# Patient Record
Sex: Female | Born: 1950 | Race: White | Hispanic: No | Marital: Married | Smoking: Never smoker
Health system: Southern US, Community
[De-identification: ages and names within clinical notes are randomized; demographics above are authoritative.]

## PROBLEM LIST (undated history)

## (undated) DIAGNOSIS — K5120 Ulcerative (chronic) proctitis without complications: Secondary | ICD-10-CM

## (undated) DIAGNOSIS — T7840XA Allergy, unspecified, initial encounter: Secondary | ICD-10-CM

## (undated) DIAGNOSIS — K222 Esophageal obstruction: Secondary | ICD-10-CM

## (undated) DIAGNOSIS — Z961 Presence of intraocular lens: Secondary | ICD-10-CM

## (undated) DIAGNOSIS — K449 Diaphragmatic hernia without obstruction or gangrene: Secondary | ICD-10-CM

## (undated) DIAGNOSIS — M1990 Unspecified osteoarthritis, unspecified site: Secondary | ICD-10-CM

## (undated) DIAGNOSIS — H269 Unspecified cataract: Secondary | ICD-10-CM

## (undated) DIAGNOSIS — K219 Gastro-esophageal reflux disease without esophagitis: Secondary | ICD-10-CM

## (undated) DIAGNOSIS — E079 Disorder of thyroid, unspecified: Secondary | ICD-10-CM

## (undated) HISTORY — DX: Presence of intraocular lens: Z96.1

## (undated) HISTORY — DX: Unspecified osteoarthritis, unspecified site: M19.90

## (undated) HISTORY — DX: Esophageal obstruction: K22.2

## (undated) HISTORY — PX: ESOPHAGOGASTRODUODENOSCOPY: SHX1529

## (undated) HISTORY — DX: Unspecified cataract: H26.9

## (undated) HISTORY — DX: Diaphragmatic hernia without obstruction or gangrene: K44.9

## (undated) HISTORY — PX: COLONOSCOPY: SHX174

## (undated) HISTORY — DX: Allergy, unspecified, initial encounter: T78.40XA

## (undated) HISTORY — PX: CATARACT EXTRACTION: SUR2

## (undated) HISTORY — DX: Ulcerative (chronic) proctitis without complications: K51.20

## (undated) HISTORY — DX: Gastro-esophageal reflux disease without esophagitis: K21.9

## (undated) HISTORY — DX: Disorder of thyroid, unspecified: E07.9

---

## 2006-04-26 ENCOUNTER — Ambulatory Visit: Payer: Self-pay | Admitting: Internal Medicine

## 2006-05-25 ENCOUNTER — Encounter (INDEPENDENT_AMBULATORY_CARE_PROVIDER_SITE_OTHER): Payer: Self-pay | Admitting: Specialist

## 2006-05-25 ENCOUNTER — Ambulatory Visit: Payer: Self-pay | Admitting: Internal Medicine

## 2006-05-25 DIAGNOSIS — K5120 Ulcerative (chronic) proctitis without complications: Secondary | ICD-10-CM

## 2006-07-14 ENCOUNTER — Ambulatory Visit: Payer: Self-pay | Admitting: Internal Medicine

## 2007-05-30 ENCOUNTER — Ambulatory Visit: Payer: Self-pay | Admitting: Internal Medicine

## 2007-06-02 ENCOUNTER — Encounter: Admission: RE | Admit: 2007-06-02 | Discharge: 2007-06-02 | Payer: Self-pay | Admitting: Internal Medicine

## 2007-08-25 DIAGNOSIS — K219 Gastro-esophageal reflux disease without esophagitis: Secondary | ICD-10-CM | POA: Insufficient documentation

## 2007-08-25 DIAGNOSIS — K222 Esophageal obstruction: Secondary | ICD-10-CM

## 2008-05-29 ENCOUNTER — Telehealth: Payer: Self-pay | Admitting: Internal Medicine

## 2008-06-25 ENCOUNTER — Ambulatory Visit: Payer: Self-pay | Admitting: Internal Medicine

## 2008-10-01 ENCOUNTER — Telehealth: Payer: Self-pay | Admitting: Internal Medicine

## 2010-01-27 ENCOUNTER — Telehealth: Payer: Self-pay | Admitting: Internal Medicine

## 2010-03-18 ENCOUNTER — Ambulatory Visit: Payer: Self-pay | Admitting: Internal Medicine

## 2010-08-12 NOTE — Assessment & Plan Note (Signed)
Summary: med refills--ch.    History of Present Illness Visit Type: Follow-up Visit Primary GI MD: [name] MD Primary Provider: n/a Requesting Provider: n/a Chief Complaint: refill Protonix after stopping x 6 months  Did have 1 episode of choking after stopping so resumed taking Prilosec  History of Present Illness:   This is a 60 year old white female with a history of gastroesophageal reflux and a mild esophageal stricture necessitating dilatation in 2007. She subsequently stopped taking Protonix and within several months developed recurrence of her reflux. She had it in the  [place]. She  since then has been on Prilosec 20 mg daily with marked improvement of her symptoms. She denies any dysphagia or odynophagia. Her last colonoscopy in November 2007 showed nonspecific neutrophilic cryptitis consistent with proctitis. This was treated with Canasa suppositories and she had complete resolution at that time.   GI Review of Systems    Reports dysphagia with liquids and  dysphagia with solids.      Denies abdominal pain, acid reflux, belching, bloating, chest pain, heartburn, loss of appetite, nausea, vomiting, vomiting blood, weight loss, and  weight gain.        Denies anal fissure, black tarry stools, change in bowel habit, constipation, diarrhea, diverticulosis, fecal incontinence, heme positive stool, hemorrhoids, irritable bowel syndrome, jaundice, light color stool, liver problems, rectal bleeding, and  rectal pain.    Current Medications (verified): 1)  Prilosec Otc 20 Mg Tbec (Omeprazole Magnesium) .Marland Kitchen.. 1 By Mouth Once Daily  Allergies (verified): No Known Drug Allergies  Past History:  Past Medical History: Reviewed history from 08/25/2007 and no changes required. 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 Unremarkable  Family History: Reviewed history from 06/20/2008 and no changes required. No FH of Colon Cancer:  Social History: Reviewed history from 06/25/2008 and no changes required. Alcohol Use - no Illicit Drug Use - no Patient has never smoked.  Daily Caffeine Use sodas daily  Review of Systems  The patient denies allergy/sinus, anemia, anxiety-new, arthritis/joint pain, back pain, blood in urine, breast changes/lumps, change in vision, confusion, cough, coughing up blood, depression-new, fainting, fatigue, fever, headaches-new, hearing problems, heart murmur, heart rhythm changes, itching, menstrual pain, muscle pains/cramps, night sweats, nosebleeds, pregnancy symptoms, shortness of breath, skin rash, sleeping problems, sore throat, swelling of feet/legs, swollen lymph glands, thirst - excessive , urination - excessive , urination changes/pain, urine leakage, vision changes, and voice change.         Pertinent positive and negative review of systems were noted in the above HPI. All other ROS was otherwise negative.   Vital Signs:  Patient profile:   60 year old female Height:      65 inches Weight:      151 pounds BMI:     25.22 Pulse rate:   80 / minute Pulse rhythm:   regular BP sitting:   110 / 78  (left arm)  Vitals Entered By: Milford Cage NCMA (March 18, 2010 2:10 PM)   Impression & Recommendations:  Problem # 1:  ESOPHAGEAL STRICTURE (ICD-530.3) Patient has a history of gastroesophageal reflux. She denies any dysphagia to solids or liquids.  Problem # 2:  GASTROESOPHAGEAL REFLUX DISEASE (ICD-530.81) Patient's last upper endoscopy was in 2007. She is currently asymptomatic on Prilosec 20 mg daily with only minor breakthrough symptoms. She would prefer to be back on Protonix 40 mg a  day.  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 A recall colonoscopy will be due in November 2012.  Patient Instructions: 1)   Follow antireflux measures. 2)  Protonix 40 mg daily. 3)  Recall colonoscopy November 2012. 4)  The medication list was reviewed and reconciled.  All changed / newly prescribed medications were explained.  A complete medication list was provided to the patient / caregiver. Prescriptions: PROTONIX 40 MG SOLR (PANTOPRAZOLE SODIUM) Take 1 tablet by mouth once a day  #30 x 11   Entered by:   Lamona Curl CMA (AAMA)   Authorized by:   Hart Carwin MD   Signed by:   Lamona Curl CMA (AAMA) on 03/18/2010   Method used:   Electronically to        [facility] [address] [phone]* (retail)       [address]       [place], [place]  [address]       Ph: [phone] or [phone]       Fax: [phone]   RxID:   [id number]

## 2010-08-12 NOTE — Progress Notes (Signed)
Summary: Refill Protonix  Medications Added PROTONIX 40 MG TBEC (PANTOPRAZOLE SODIUM) Take 1 tablet by mouth once a day. NO FURTHER REFILLS UNTIL OFFICE VISIT!       Phone Note Call from Patient   Caller: Patient Call For: Dr. Juanda Chance Reason for Call: Refill Medication Summary of Call: Refill on her Protonix...[facility] [address]  Appt. sch'd for 03-18-10 Initial call taken by: Karna Christmas,  January 27, 2010 8:55 AM  Follow-up for Phone Call        I spoke with patient last year and provided patient with another medication provided she agree to come for evaluation in December 2010. Patient never came. It has been 2 years since we last saw her. Left message the she will need to purchase Protonix OTC until we see her in the office on 03/18/10 at which time we will send Protonix to her pharmacy if needed.    New/Updated Medications: PROTONIX 40 MG TBEC (PANTOPRAZOLE SODIUM) Take 1 tablet by mouth once a day. NO FURTHER REFILLS UNTIL OFFICE VISIT! Prescriptions: PROTONIX 40 MG TBEC (PANTOPRAZOLE SODIUM) Take 1 tablet by mouth once a day. NO FURTHER REFILLS UNTIL OFFICE VISIT!  #30 x 1   Entered by:   Lamona Curl CMA (AAMA)   Authorized by:   Hart Carwin MD   Signed by:   Lamona Curl CMA (AAMA) on 01/27/2010   Method used:   Electronically to        [facility]. [address] [phone]* (retail)       [address]       [place], [place]  [address]       Ph: [phone] or [phone]       Fax: [phone]   RxID:   [id number]  of note: sent [facility] of [facility]ix to [facility] in error before realizing that patient is overdue for office visit as we agreed on previously. I have called [facility] to d/c that prescription. Dottie Nelson-Smith CMA Duncan Dull)  January 27, 2010 9:49 AM

## 2010-11-25 NOTE — Assessment & Plan Note (Signed)
[facility]                         GASTROENTEROLOGY OFFICE NOTE   Cindy Dickson, Cindy Dickson                    MRN:          [id number]  DATE:05/30/2007                            DOB:          [birth year]    HISTORY:  Cindy Dickson is a 60 year old white female, recently diagnosed  with ulcerative proctitis.  This was done on a routine colonoscopy in  November 2007.  She is due for a repeat colonoscopy in November 2010.  Biopsies showed active colitis in the rectum.  She since then has  responded to Canasa suppositories, but whenever she discontinues this,  the bleeding comes back.  She denies any rectal pain or abdominal pain.  Her bowel habits are regular.   Cindy Dickson also has a history of gastroesophageal reflux disease,  benign distal esophageal stricture requiring dilatation in November  2007.  She has no symptoms since she has been switched to Protonix 40 mg  daily.  Prior to that she was on Prilosec 20 mg twice daily.   CURRENT MEDICATIONS:  1. Protonix 40 mg p.o. daily.  2. Multiple vitamins.  3. Canasa suppositories 1000 mg p.r.n.   PHYSICAL EXAMINATION:  VITAL SIGNS:  Blood pressure 110/70, pulse 72,  weight 150 pounds.  GENERAL:  She appeared healthy.  LUNGS:  Clear to auscultation.  COR:  S1 and S2 normal.  ABDOMEN:  Soft, nontender.  Normoactive bowel sounds.  No focal  tenderness.  RECTAL/ANOSCOPY:  Exams reveal normal peri-anal area.  Contact bleeding  in the anal canal, first-grade internal hemorrhoids, as well as  proctitis, when in the anal canal, with some edema and prolapse of the  rectal mucosa, which was quite friable.   IMPRESSION:  38. A 60 year old white female with ulcerative proctitis which is      rather asymptomatic and is also combined with small hemorrhoids:      It is not clear to me as to whether the bleeding comes from      hemorrhoids or from the proctitis.  She is reluctant to take Canasa      because of  its cost.  We will switch her to Anusol suppositories.  2. Gastroesophageal reflux disease with stricture, currently under      good control.   PLAN:  1. Refills for Protonix 40 mg daily.  2. Anusol-HC suppositories q.h.s., with three refills.   FOLLOWUP:  I will see her again in one year, and a repeat colonoscopy in  November 2010.     Hedwig Morton. Juanda Chance, MD  Electronically Signed    DMB/MedQ  DD: 05/30/2007  DT: 05/31/2007  Job #: [id number]

## 2010-11-28 NOTE — Assessment & Plan Note (Signed)
[facility]                           GASTROENTEROLOGY OFFICE NOTE   Cindy Dickson, Cindy Dickson                    MRN:          [id number]  DATE:04/26/2006                            DOB:          [birth year]    Cindy Dickson is a very nice 60 year old white female who has two GI issues.  One is gastroesophageal reflux, which she has had for many years and was  told to have a hiatal hernia but never had an upper endoscopy.  She had  episodes of solid food dysphagia and food regurgitation until her brother-in-  law advised for her to take Prilosec 20 mg, which she took for a period of  time with complete relief of her reflux symptoms.  She was subsequently put  on Protonix 40 mg a day, which controlled her symptoms very well but had  only one month's supply.  Currently, her dysphagia is resolved, and she is  back on Prilosec 20 mg a day.  There is some burping and belching.  Occasional hoarseness but no cough.  She takes Aleve on a p.r.n. basis.   The other issue has been colorectal screening.  She has blood per rectum at  almost every bowel movement in small amounts, usually on the toilet tissue.  She denies any rectal pain, irritation, burning.   MEDICATIONS:  Prilosec 20 mg daily, Aleve.   PAST MEDICAL HISTORY:  Operations none.  Illnesses none.   FAMILY HISTORY:  Negative for colon cancer or polyps.   SOCIAL HISTORY:  She is married.  Has one child.  She does not smoke.  Does  not drink alcohol.   REVIEW OF SYSTEMS:  Positive for eye glasses and night sweats.   PHYSICAL EXAMINATION:  VITAL SIGNS:  Blood pressure 110/68, pulse 80. Weight  159 pounds.  GENERAL:  She was alert and oriented in no distress.  Her voice was normal.  NECK:  Supple with no adenopathy.  HEENT:  Oral cavity was normal.  LUNGS:  Clear to auscultation.  COR:  Normal.  S1 and S2.  ABDOMEN:  Soft, nontender with normoactive bowel sounds.  She had minimal  tenderness over  the subxiphoid area and xiphoid process.  Bowel sounds were  normoactive.  RECTAL:  Trace hemoccult positive stool.   IMPRESSION:  73. A 60 year old white female with chronic gastroesophageal reflux      disease, currently improved on PPI's.  Intermittent solid food      dysphagia suggestive of fibrous ring or stricture.  Reflux most likely      aggravated by use of Aleve.  2. Hemoccult positive stool.  This could be from hemorrhoids, rectal      polyp, or __________ an upper GI lesion as well.  She denies ever being      anemic.   PLAN:  1. Upper endoscopy with possibly dilation and biopsies to rule out      Barrett's esophagus.  2. Colonoscopy scheduled.       Hedwig Morton. Juanda Chance, MD      DMB/MedQ  DD:  04/26/2006  DT:  04/27/2006  Job #:  [id number]

## 2010-11-28 NOTE — Assessment & Plan Note (Signed)
[facility]                         GASTROENTEROLOGY OFFICE NOTE   LINDZY, RUPERT                    MRN:          [id number]  DATE:07/14/2006                            DOB:          March 21, 1951    Ms. Cindy Dickson is a 60 year old white female who was diagnosed with acute  proctitis.  On routine colonoscopy on May 25, 2006 biopsy showed  some neutrophilic cryptitis.  She was put on Canasa suppositories with  complete resolution of rectal bleeding and mucus-like stools after about  5 days.  She had a 20 day treatment of this with Canasa suppositories.  After stopping the medication several weeks ago the blood has  reappeared.  She denies abdominal pain, diarrhea, but has seen blood  with almost every bowel movement.   Patient was also evaluated for chronic esophageal reflux and was found  to have a small hiatal hernia and esophageal stricture.  Patient was  dilated with [facility] dilators.  The dysphagia has subsided.  She is now  well controlled on Prilosec 20 mg daily.   PHYSICAL EXAMINATION:  Blood pressure 100/70, pulse 80, weight 151  pounds which represents a 9 pound weight loss which is intentional.  ABDOMEN:  Normal exam was unremarkable.  LUNGS:  Clear to auscultation.  COR:  Normal S1, S2.    Endoscopic exam shows normal perianal area, normal rectal tone, 2-3+  colitis in the rectum with some contact bleeding, friability and  purulent mucus.  No stool.  Findings are consistent with active  proctitis.   IMPRESSION:  Acute proctitis consistent with active proctitis.  Recurrence after discontinuation of Canasa suppositories.  Recent  colonoscopy did not show any extension beyond rectosigmoid colon.   PLAN:  1. Resume Canasa suppositories 1000 mg nightly for at least a week or      two, then cut back to every other day, and even to 2-3 times a      week.  Monitor for bleeding.  If the bleeding recurs increase the      frequency  of Canasa suppositories.  We had discussed the      possibility that this may be a chronic occurrence and as long as      the medication works we would continue with topical treatment.  I      would like to reexamine her in 8 weeks.  2. May increase Prilosec to b.i.d. if she becomes symptomatic with      gastroesophageal reflux.     Hedwig Morton. Juanda Chance, MD  Electronically Signed    DMB/MedQ  DD: 07/14/2006  DT: 07/14/2006  Job #: [id number]

## 2011-03-19 ENCOUNTER — Other Ambulatory Visit: Payer: Self-pay | Admitting: Internal Medicine

## 2011-05-01 ENCOUNTER — Other Ambulatory Visit: Payer: Self-pay | Admitting: Internal Medicine

## 2011-05-01 MED ORDER — PANTOPRAZOLE SODIUM 40 MG PO TBEC: DELAYED_RELEASE_TABLET | ORAL | Status: DC

## 2011-05-01 NOTE — Telephone Encounter (Signed)
rx sent

## 2011-05-13 ENCOUNTER — Other Ambulatory Visit: Payer: Self-pay | Admitting: Internal Medicine

## 2011-05-13 MED ORDER — PANTOPRAZOLE SODIUM 40 MG PO TBEC: DELAYED_RELEASE_TABLET | ORAL | Status: DC

## 2011-05-13 NOTE — Telephone Encounter (Signed)
rx sent

## 2011-06-09 ENCOUNTER — Encounter: Payer: Self-pay | Admitting: *Deleted

## 2011-06-15 ENCOUNTER — Encounter: Payer: Self-pay | Admitting: Internal Medicine

## 2011-06-15 ENCOUNTER — Ambulatory Visit (INDEPENDENT_AMBULATORY_CARE_PROVIDER_SITE_OTHER): Payer: 59 | Admitting: Internal Medicine

## 2011-06-15 VITALS — BP 120/72 | HR 68 | Ht 65.0 in | Wt 157.0 lb

## 2011-06-15 DIAGNOSIS — K219 Gastro-esophageal reflux disease without esophagitis: Secondary | ICD-10-CM

## 2011-06-15 DIAGNOSIS — K222 Esophageal obstruction: Secondary | ICD-10-CM

## 2011-06-15 MED ORDER — PANTOPRAZOLE SODIUM 40 MG PO TBEC: DELAYED_RELEASE_TABLET | ORAL | Status: DC

## 2011-06-15 NOTE — Patient Instructions (Signed)
We have sent the following medications to your pharmacy for you to pick up at your convenience:  Pantoprazole  

## 2011-06-15 NOTE — Progress Notes (Signed)
Cindy Dickson 1951/05/03 MRN [id number]    History of Present Illness:  This is a 60 year old white female with chronic gastroesophageal reflux under good control with pantoprazole 40 mg daily. She has a history of a mild esophageal stricture on an upper endoscopy in November 2007 which was done for dysphagia. She was dilated with 15, 16, 17 mm Savary dilators. She denies having any dysphagia or odynophagia. She was found to have a 3 cm hiatal hernia. She takes generic Protonix 40 mg every morning without any breakthrough symptoms. Her last colonoscopy in November 2007 showed chronic active colitis. The biopsies showed crypt distortion and foci ulceration with mild neutrophilia cryptitis. The findings most likely represented chronic active inflammatory bowel disease with focal ulceration. Patient denies having any symptoms. She denies rectal bleeding, rectal pain, diarrhea or leakage. She is convinced that it was her hemorrhoids. We had her up for a recall colonoscopy for 3 years but she declines at this time.   Past Medical History  Diagnosis Date  . Esophageal stricture   . GERD (gastroesophageal reflux disease)   . Ulcerative proctitis   . Hiatal hernia    No past surgical history on file.  reports that she has never smoked. She has never used smokeless tobacco. She reports that she does not drink alcohol or use illicit drugs. family history is negative for Colon cancer. No Known Allergies      Review of Systems: Denies heartburn, negative for dysphagia odynophagia. Chest pain or shortness of breath  The remainder of the 10 point ROS is negative except as outlined in H&P   Physical Exam: General appearance  Well developed, in no distress. Eyes- non icteric. HEENT nontraumatic, normocephalic. Mouth no lesions, tongue papillated, no cheilosis. Neck supple without adenopathy, thyroid not enlarged, no carotid bruits, no JVD. Lungs Clear to auscultation bilaterally. Cor normal  S1, normal S2, regular rhythm, no murmur,  quiet precordium. Abdomen: Soft nontender abdomen with normal active bowel sounds. No focal tenderness. Normoactive bowel sounds. Rectal: Not done. Extremities no pedal edema. Skin no lesions. Neurological alert and oriented x 3. Psychological normal mood and affect.  Assessment and Plan:  Problem #1 Chronic gastroesophageal reflux and esophageal stricture is well-controlled on pantoprazole 40 mg a day. Patient is here for refills. She needs to continue on her medications to prevent recurrence of an esophageal stricture. She will follow antireflux measures.  Problem #2 History of ulcerative proctitis. Patient is currently asymptomatic. She sould have a recall colonoscopy in November 2014.   06/15/2011 Cindy Dickson

## 2011-07-16 ENCOUNTER — Encounter: Payer: Self-pay | Admitting: Internal Medicine

## 2012-08-03 ENCOUNTER — Other Ambulatory Visit: Payer: Self-pay | Admitting: Internal Medicine

## 2012-08-03 MED ORDER — PANTOPRAZOLE SODIUM 40 MG PO TBEC: DELAYED_RELEASE_TABLET | ORAL | Status: DC

## 2012-08-03 NOTE — Telephone Encounter (Signed)
rx sent

## 2012-09-13 ENCOUNTER — Ambulatory Visit: Payer: 59 | Admitting: Internal Medicine

## 2012-10-18 ENCOUNTER — Ambulatory Visit: Payer: 59 | Admitting: Internal Medicine

## 2012-10-20 ENCOUNTER — Ambulatory Visit (INDEPENDENT_AMBULATORY_CARE_PROVIDER_SITE_OTHER): Payer: 59 | Admitting: Emergency Medicine

## 2012-10-20 VITALS — BP 117/72 | HR 86 | Temp 98.1°F | Resp 16 | Ht 64.5 in | Wt 160.0 lb

## 2012-10-20 DIAGNOSIS — IMO0002 Reserved for concepts with insufficient information to code with codable children: Secondary | ICD-10-CM

## 2012-10-20 DIAGNOSIS — Z23 Encounter for immunization: Secondary | ICD-10-CM

## 2012-10-20 NOTE — Progress Notes (Signed)
[facility] [address] [place] [address] [phone]  Date:  10/20/2012   Name:  Cindy Dickson   DOB:  29-Dec-1950   MRN:  [id number]  PCP:  No primary provider on file.    Chief Complaint: Laceration   History of Present Illness:  Cindy Dickson is a 62 y.o. very pleasant female patient who presents with the following:  Laceration right second finger.  Not current on TD.  No improvement with over the counter medications or other home remedies. Denies other complaint or health concern today.   Patient Active Problem List  Diagnosis  . ESOPHAGEAL STRICTURE  . GASTROESOPHAGEAL REFLUX DISEASE  . ULCERATIVE PROCTITIS    Past Medical History  Diagnosis Date  . Esophageal stricture   . GERD (gastroesophageal reflux disease)   . Ulcerative proctitis   . Hiatal hernia     History reviewed. No pertinent past surgical history.  History  Substance Use Topics  . Smoking status: Never Smoker   . Smokeless tobacco: Never Used  . Alcohol Use: No    Family History  Problem Relation Age of Onset  . Colon cancer Neg Hx     No Known Allergies  Medication list has been reviewed and updated.  Current Outpatient Prescriptions on File Prior to Visit  Medication Sig Dispense Refill  . pantoprazole (PROTONIX) 40 MG tablet Take 1 tablet by mouth once daily.  90 tablet  0   No current facility-administered medications on file prior to visit.    Review of Systems:  As per HPI, otherwise negative.    Physical Examination: Filed Vitals:   10/20/12 1615  BP: 117/72  Pulse: 86  Temp: 98.1 F (36.7 C)  Resp: 16   Filed Vitals:   10/20/12 1615  Height: 5' 4.5" (1.638 m)  Weight: 160 lb (72.576 kg)   Body mass index is 27.05 kg/(m^2). Ideal Body Weight: Weight in (lb) to have BMI = 25: 147.6   GEN: WDWN, NAD, Non-toxic, Alert & Oriented x 3 HEENT: Atraumatic, Normocephalic.  Ears and Nose: No external deformity. EXTR: No  clubbing/cyanosis/edema NEURO: Normal gait.  PSYCH: Normally interactive. Conversant. Not depressed or anxious appearing.  Calm demeanor.  Superficial laceration right second finger tip.  Assessment and Plan: Laceration finger TDAP   Signed,  Phillips Odor, MD

## 2012-10-20 NOTE — Patient Instructions (Addendum)

## 2012-10-21 NOTE — Progress Notes (Signed)
Reviewed and agree.

## 2012-11-14 ENCOUNTER — Other Ambulatory Visit: Payer: Self-pay | Admitting: Internal Medicine

## 2012-11-28 ENCOUNTER — Ambulatory Visit (INDEPENDENT_AMBULATORY_CARE_PROVIDER_SITE_OTHER): Payer: 59 | Admitting: Internal Medicine

## 2012-11-28 ENCOUNTER — Encounter: Payer: Self-pay | Admitting: Internal Medicine

## 2012-11-28 ENCOUNTER — Other Ambulatory Visit (INDEPENDENT_AMBULATORY_CARE_PROVIDER_SITE_OTHER): Payer: 59

## 2012-11-28 VITALS — BP 118/76 | HR 89 | Ht 65.0 in | Wt 162.0 lb

## 2012-11-28 DIAGNOSIS — E041 Nontoxic single thyroid nodule: Secondary | ICD-10-CM

## 2012-11-28 DIAGNOSIS — K219 Gastro-esophageal reflux disease without esophagitis: Secondary | ICD-10-CM

## 2012-11-28 LAB — CBC WITH DIFFERENTIAL/PLATELET
Basophils Absolute: 0 10*3/uL (ref 0.0–0.1)
Eosinophils Absolute: 0.2 10*3/uL (ref 0.0–0.7)
HCT: 37 % (ref 36.0–46.0)
Hemoglobin: 12.5 g/dL (ref 12.0–15.0)
MCHC: 33.7 g/dL (ref 30.0–36.0)
MCV: 84.6 fl (ref 78.0–100.0)
Monocytes Relative: 7.2 % (ref 3.0–12.0)
Neutro Abs: 5.8 10*3/uL (ref 1.4–7.7)
WBC: 9.4 10*3/uL (ref 4.5–10.5)

## 2012-11-28 LAB — COMPREHENSIVE METABOLIC PANEL
ALT: 18 U/L (ref 0–35)
Albumin: 4.1 g/dL (ref 3.5–5.2)
BUN: 14 mg/dL (ref 6–23)
Calcium: 9.4 mg/dL (ref 8.4–10.5)
Creatinine, Ser: 1.1 mg/dL (ref 0.4–1.2)
GFR: 54.16 mL/min — ABNORMAL LOW (ref >60.00)
Total Protein: 8 g/dL (ref 6.0–8.3)

## 2012-11-28 MED ORDER — PANTOPRAZOLE SODIUM 40 MG PO TBEC: 40.0000 mg | DELAYED_RELEASE_TABLET | Freq: Every day | ORAL | Status: DC

## 2012-11-28 NOTE — Patient Instructions (Addendum)
Your physician has requested that you go to the basement for the following lab work before leaving today: CBC, CMET, TSH  We have sent the following medications to your pharmacy for you to pick up at your convenience: Pantoprazole

## 2012-11-28 NOTE — Progress Notes (Signed)
Cindy Dickson 1951-04-14 MRN [id number]        History of Present Illness:  This is a 62 year old white female with chronic gastroesophageal reflux under good control with pantoprazole 40 mg daily. She had an upper endoscopy in November 2007 which showed mild stricture and 3 cm hiatal hernia. She was dilated with 15,16 and 17 mm dilators resulting in complete relief of her symptoms, she also had a colonoscopy in November 2007 with question of chronic active colitis but she currently denies any symptoms of diarrhea, abdominal pain or constipation. She is here today for refill of  pantoprazole   Past Medical History  Diagnosis Date  . Esophageal stricture   . GERD (gastroesophageal reflux disease)   . Ulcerative proctitis   . Hiatal hernia    History reviewed. No pertinent past surgical history.  reports that she has never smoked. She has never used smokeless tobacco. She reports that she does not drink alcohol or use illicit drugs. family history is negative for Colon cancer. No Known Allergies      Review of Systems:  The remainder of the 10 point ROS is negative except as outlined in H&P   Physical Exam: General appearance  Well developed, in no distress. Eyes- non icteric. HEENT nontraumatic, normocephalic. Mouth no lesions, tongue papillated, no cheilosis. Neck supple without adenopathy, 2-3 cm thyroid nodule left lobe. Nontender. Moves with swallowing. No bruit, no carotid bruits, no JVD. Lungs Clear to auscultation bilaterally. Cor normal S1, normal S2, regular rhythm, no murmur,  quiet precordium. Abdomen: Soft nontender with normoactive bowel sounds Rectal: Not done Extremities no pedal edema. Skin no lesions. Neurological alert and oriented x 3. Psychological normal mood and affect.  Assessment and Plan:  Chronic gastroesophageal reflux disease under good control with pantoprazole 40 mg daily. She will continue antireflux measures. Currently no need for  repeat upper endoscopy or dilatation  Left lobe of thyroid nodule versus cyst. Obtain TSH level. May require ultrasound of the neck. Patient does not have a primary care physician,   Colorectal screening. Last colonoscopy 2007 with findings of colitis. Patient reluctant to do any diagnostic workup since  she is asymptomatic. We will check her CBC and sed rates today      11/28/2012 [name]

## 2012-11-29 ENCOUNTER — Encounter: Payer: Self-pay | Admitting: Internal Medicine

## 2012-11-30 ENCOUNTER — Other Ambulatory Visit: Payer: Self-pay | Admitting: *Deleted

## 2012-11-30 DIAGNOSIS — E049 Nontoxic goiter, unspecified: Secondary | ICD-10-CM

## 2012-12-06 ENCOUNTER — Ambulatory Visit (HOSPITAL_COMMUNITY)
Admission: RE | Admit: 2012-12-06 | Discharge: 2012-12-06 | Disposition: A | Discharge status: Home / Self Care | Payer: 59 | Source: Ambulatory Visit | Attending: Internal Medicine | Admitting: Internal Medicine

## 2012-12-06 DIAGNOSIS — E049 Nontoxic goiter, unspecified: Secondary | ICD-10-CM | POA: Insufficient documentation

## 2012-12-06 DIAGNOSIS — R599 Enlarged lymph nodes, unspecified: Secondary | ICD-10-CM | POA: Insufficient documentation

## 2012-12-07 ENCOUNTER — Other Ambulatory Visit: Payer: Self-pay | Admitting: *Deleted

## 2012-12-07 DIAGNOSIS — E079 Disorder of thyroid, unspecified: Secondary | ICD-10-CM

## 2012-12-15 ENCOUNTER — Encounter: Payer: Self-pay | Admitting: Internal Medicine

## 2012-12-15 ENCOUNTER — Ambulatory Visit (INDEPENDENT_AMBULATORY_CARE_PROVIDER_SITE_OTHER): Payer: 59 | Admitting: Internal Medicine

## 2012-12-15 VITALS — BP 122/80 | HR 78 | Ht 65.0 in | Wt 160.0 lb

## 2012-12-15 DIAGNOSIS — E041 Nontoxic single thyroid nodule: Secondary | ICD-10-CM

## 2012-12-15 MED ORDER — LORAZEPAM 0.5 MG PO TABS: ORAL_TABLET | ORAL | Status: DC

## 2012-12-15 NOTE — Patient Instructions (Addendum)
Please return in 1 year. Please join MyChart. I will let you know the results of the biopsy through MyChart.

## 2012-12-15 NOTE — Progress Notes (Signed)
Subjective:     Patient ID: Cindy Dickson, female   DOB: [birth year], 62 y.o.   MRN: [id number]  HPI Cindy Dickson he is a pleasant 62 year old woman, referred by Cindy Dickson (gastroenterology), for management of a recently discovered left thyroid nodule.  Patient is seeing Cindy Dickson for GERD, mild esophageal stricture - status post dilation in 2007, with complete relief of symptoms, hiatal hernia, and ulcerative proctitis. During recent examination by Cindy Dickson, patient was found to have an enlarged left side of the thyroid, and she was sent for a thyroid ultrasound. This was performed on 12/07/2012 and it showed inhomogeneous echogenicity, and also a large, solitary, solid, mass of 3.1 x 2.5 x 2.1 cm in the left thyroid lobe, without calcifications. There is no internal blood flow. She also had the left cervical lymph node measuring 9 x 5 x 7 mm (characterized as normal). Patient was referred to me for further evaluation. Of note, a TSH obtained on 11/28/2012 was normal, at 2.8.  The patient denies problems with swallowing, hoarseness, shortness of breath with lying down, or feeling a mass in her neck before the nodule was palpated by Cindy Dickson. Now, that she is aware of its presence, she can feel it, too. She also denies heat or cold intolerance, palpitations, weight loss (he has 8 pound weight gain in the last 2 months), constipation/hyperdefecation, insomnia/hypersomnia, depression/anxiety, dry skin or hair falling.  She does not have family history of thyroid disease, no family history of thyroid cancer, 62 no personal history of radiation therapy to head or neck.  Review of Systems Constitutional: + weight gain, no fatigue, no subjective hyperthermia/hypothermia Eyes: no blurry vision, no xerophthalmia ENT: no sore throat, + nodules palpated in throat - only after found out about thyroid nodule, no dysphagia/odynophagia, no hoarseness Cardiovascular: no CP/SOB/palpitations/leg  swelling Respiratory: no cough/SOB Gastrointestinal: no N/V/D/C, + GERD Musculoskeletal: no muscle/joint aches Skin: no rashes Neurological: no tremors/numbness/tingling/dizziness Psychiatric: no depression/anxiety  Past Medical History  Diagnosis Date  . Esophageal stricture   . GERD (gastroesophageal reflux disease)   . Ulcerative proctitis   . Hiatal hernia    No past surgical history on file.  History   Social History  . Marital Status: Married    Spouse Name: N/A    Number of Children: 1, of 97 y/o   Occupational History  . Self-employed   Social History Main Topics  . Smoking status: Never Smoker   . Alcohol Use: No  . Drug Use: No   Social History Narrative   Caffeine daily    Current Outpatient Prescriptions on File Prior to Visit  Medication Sig Dispense Refill  . pantoprazole (PROTONIX) 40 MG tablet TAKE 1 TABLET BY MOUTH ONCE DAILY  30 tablet  0  . pantoprazole (PROTONIX) 40 MG tablet Take 1 tablet (40 mg total) by mouth daily.  90 tablet  3   No current facility-administered medications on file prior to visit.   No Known Allergies  Family History  Problem Relation Age of Onset  . Colon cancer Neg Hx     Objective:   Physical Exam BP 126/74  Pulse 82  Ht 5\' 2"  (1.575 m)  Wt 168 lb (76.204 kg)  BMI 30.72 kg/m2  SpO2 98% Wt Readings from Last 3 Encounters:  12/15/12 168 lb (76.204 kg)  11/28/12 162 lb (73.483 kg)  10/20/12 160 lb (72.576 kg)   Constitutional: overweight, in NAD, looking younger than stated age Eyes: PERRLA, EOMI, no exophthalmos ENT:  moist mucous membranes, thyromegaly L>R, no cervical lymphadenopathy Cardiovascular: RRR, No MRG Respiratory: CTA B Gastrointestinal: abdomen soft, NT, ND, BS+ Musculoskeletal: no deformities, strength intact in all 4 Skin: moist, warm, no rashes Neurological: no tremor with outstretched hands, DTR normal in all 4    Assessment:     1. Left thyroid nodule  - 12/07/2012: thyroid ultrasound  with 3.1 x 2.5 x 2.1 cm in the left thyroid lobe, without calcifications. There is no internal blood flow. She also had the left cervical lymph node measuring 9 x 5 x 7 mm (with fatty hilum).  - TSH 2.8 (11/28/2012)    Plan:     I reviewed the images of her thyroid ultrasound along with the patient. I pointed out that the nodule is indeed very large, this being the only apparent risk factor for cancer. Otherwise, the nodule is hyperechoic, without calcifications, without internal blood flow, more wide than tall, and well delimited from surrounding tissue. All these would favor benignity. I believe her risk of cancer is less than 10-15%. - the only way that we can tell exactly if it is cancer or not is by doing a thyroid biopsy (FNA). This is a problem, since the patient has phobia of needles. We discussed about other options, to wait for another 6 months to a year and see if the nodule grows, and only intervene at that time, were even to perform hemithyroidectomy. I think this latter option is premature for now and so does the patient. I did explain that, while thyroid surgery is not a complicated one, it still can have side effects and also she might have a risk of ~25% of becoming hypothyroid after hemithyroidectomy. - patient thought about it and decided to have the FNA done now. I ordered the test, and I also gave her a prescription for lorazepam to take one to 2 tablets before the procedure. I advised her to have somebody to drive her to the procedure (her husband will do this). - I'll see her back in a year, assuming her FNA is normal. If FNA abnormal, we will meet sooner. - I advised her to join my chart and I will send her the results through there     Adequacy Reason Satisfactory For Evaluation. Diagnosis THYROID, FINE NEEDLE ASPIRATION, LEFT LOBE: FOLLICULAR LESION OF UNDETERMINED SIGNIFICANCE. SEE COMMENT. COMMENT: THE SPECIMEN IS HYPERCELLULAR AND CONSISTS OF SMALL TO LARGE SIZED SHEETS  OF FOLLICULAR EPITHELIAL CELLS WITH MILD CYTOLOGIC ATYPIA. THERE IS MINIMAL BACKGROUND COLLOID. BASED ON THESE FINDINGS, A FOLLICULAR LESION/NEOPLASM CAN NOT BE ENTIRELY RULED OUT. Cindy Leisure MD Pathologist, Electronic Signature (Case signed 12/31/2012) Specimen Clinical Information Left large solitary mass 3.1 x 2.5 x 2.1 cm, Goiter on left side of neck  Called pt about FLUS results >> will repeat Bx in 6 months (ordered).

## 2012-12-29 ENCOUNTER — Ambulatory Visit
Admission: RE | Admit: 2012-12-29 | Discharge: 2012-12-29 | Disposition: A | Discharge status: Home / Self Care | Payer: 59 | Source: Ambulatory Visit | Attending: Internal Medicine | Admitting: Internal Medicine

## 2012-12-29 ENCOUNTER — Other Ambulatory Visit (HOSPITAL_COMMUNITY)
Admission: RE | Admit: 2012-12-29 | Discharge: 2012-12-29 | Disposition: A | Discharge status: Home / Self Care | Payer: 59 | Source: Ambulatory Visit | Attending: Diagnostic Radiology | Admitting: Diagnostic Radiology

## 2012-12-29 DIAGNOSIS — E049 Nontoxic goiter, unspecified: Secondary | ICD-10-CM | POA: Insufficient documentation

## 2013-01-05 ENCOUNTER — Other Ambulatory Visit: Payer: Self-pay | Admitting: Internal Medicine

## 2013-01-05 ENCOUNTER — Telehealth: Payer: Self-pay | Admitting: Internal Medicine

## 2013-01-05 DIAGNOSIS — E041 Nontoxic single thyroid nodule: Secondary | ICD-10-CM

## 2013-01-05 NOTE — Addendum Note (Signed)
Addended by: Carlus Pavlov on: 01/05/2013 04:46 PM   Modules accepted: Orders

## 2013-01-05 NOTE — Telephone Encounter (Signed)
Call patient with her results she has been waiting a week

## 2013-01-05 NOTE — Telephone Encounter (Signed)
Called pt with results and plan.  

## 2013-01-05 NOTE — Telephone Encounter (Signed)
Please read note below and advise.  

## 2013-02-27 ENCOUNTER — Encounter: Payer: Self-pay | Admitting: Internal Medicine

## 2013-08-10 ENCOUNTER — Telehealth: Payer: Self-pay | Admitting: Radiology

## 2013-08-10 NOTE — Telephone Encounter (Signed)
Left message requesting patient call to schedule thyroid biopsy.  Empress Newmann Riki Rusk, RN 08/10/2013 2:45 PM

## 2013-10-06 ENCOUNTER — Ambulatory Visit (HOSPITAL_COMMUNITY)
Admission: RE | Admit: 2013-10-06 | Discharge: 2013-10-06 | Disposition: A | Discharge status: Home / Self Care | Payer: 59 | Source: Ambulatory Visit | Attending: Internal Medicine | Admitting: Internal Medicine

## 2013-10-06 DIAGNOSIS — E041 Nontoxic single thyroid nodule: Secondary | ICD-10-CM | POA: Insufficient documentation

## 2013-12-09 ENCOUNTER — Encounter: Payer: Self-pay | Admitting: Internal Medicine

## 2013-12-15 ENCOUNTER — Ambulatory Visit: Payer: 59 | Admitting: Internal Medicine

## 2013-12-29 IMAGING — US US THYROID BIOPSY
1 series · 7 of 7 positions shown · non-contrast
Comparison: 12/06/2012

CLINICAL DATA: Dominant left thyroid nodule.

ULTRASOUND GUIDED NEEDLE ASPIRATE BIOPSY OF THE THYROID GLAND

[Series 1: us thyroid biopsy · 0.07mm/px · 7 acquisitions, 7 frames shown]
[im 1/7]
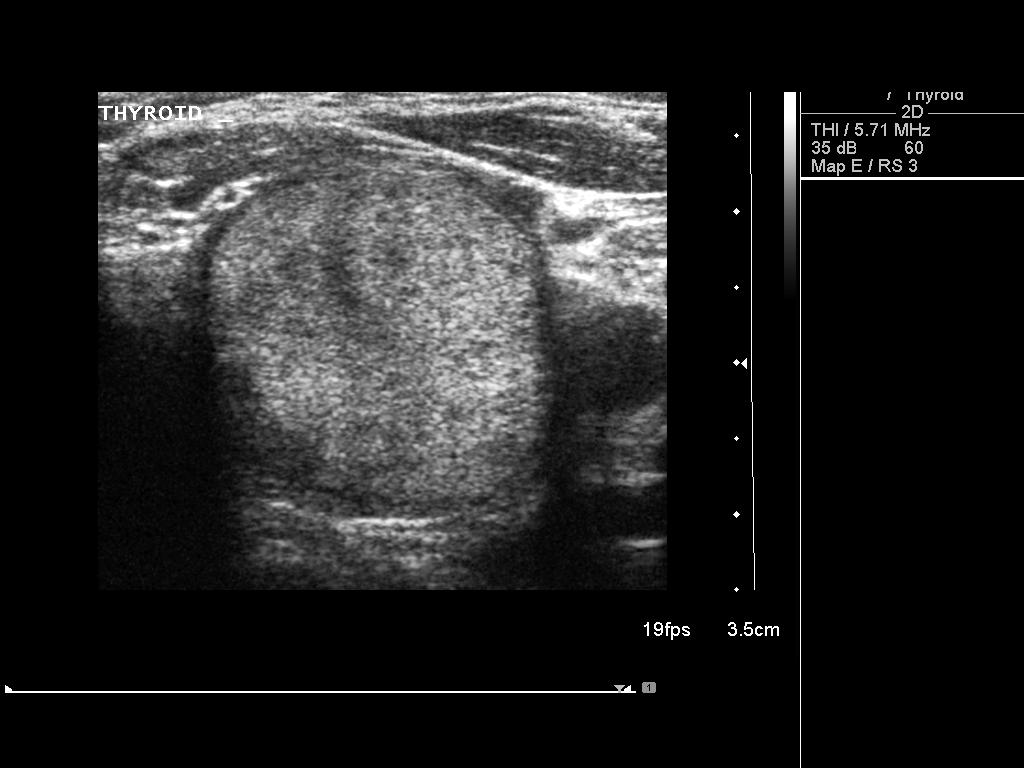
[im 2/7]
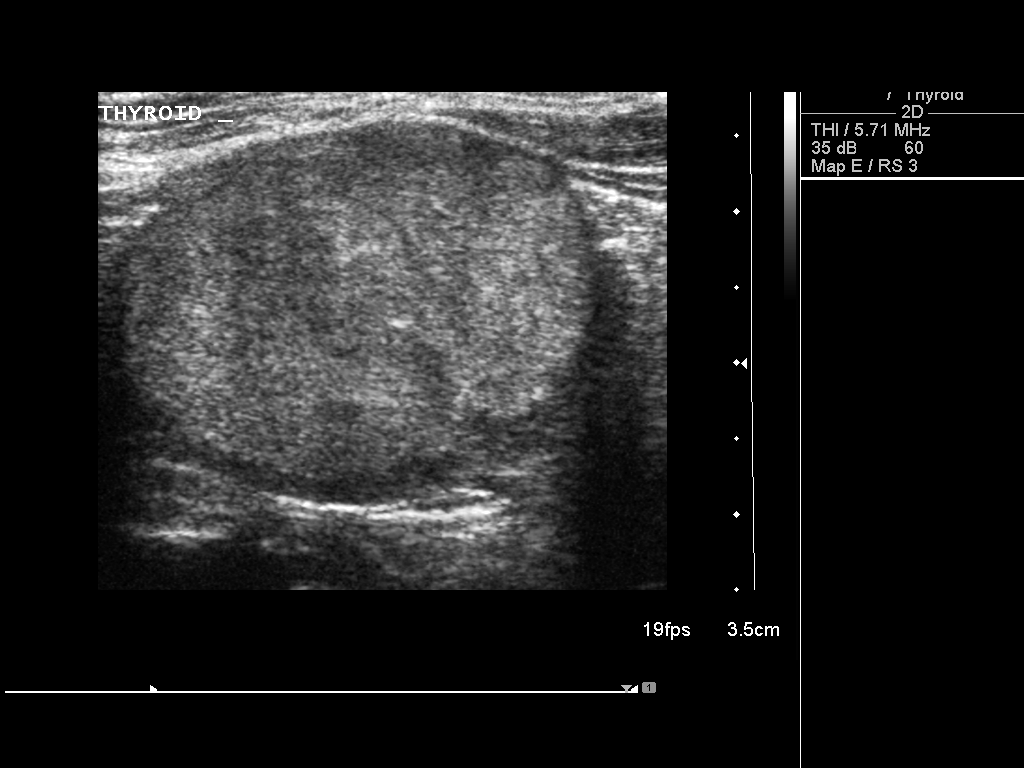
[im 3/7]
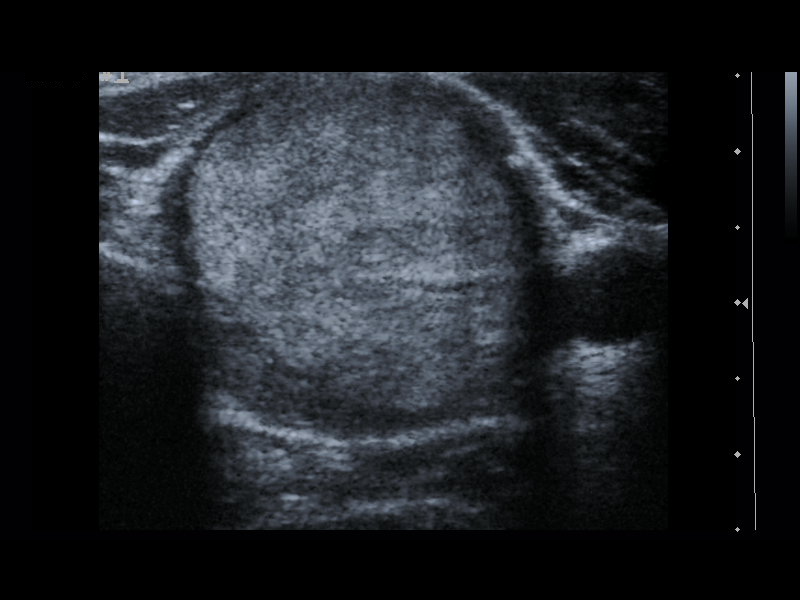
[im 4/7]
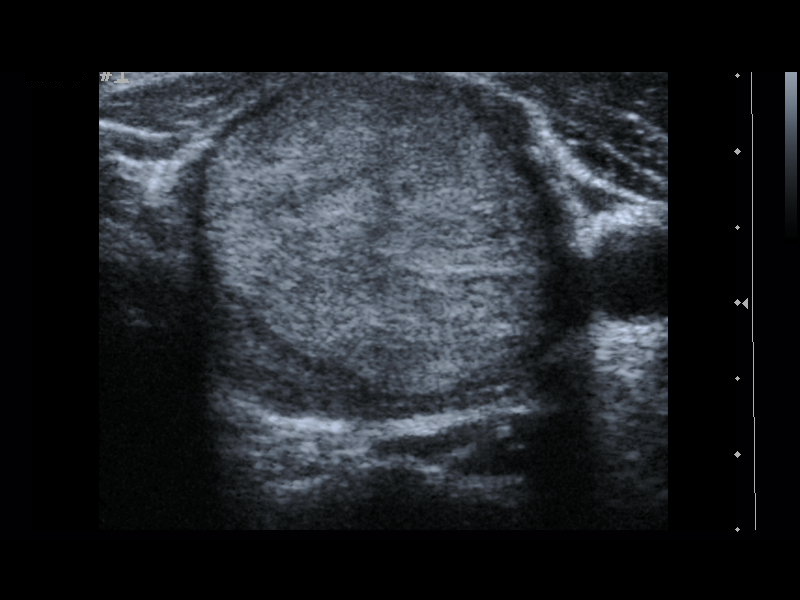
[im 5/7]
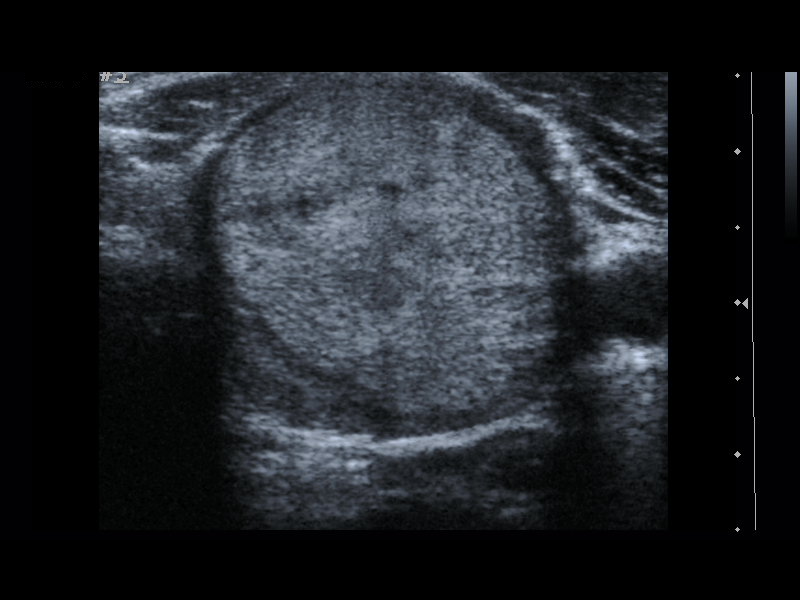
[im 6/7]
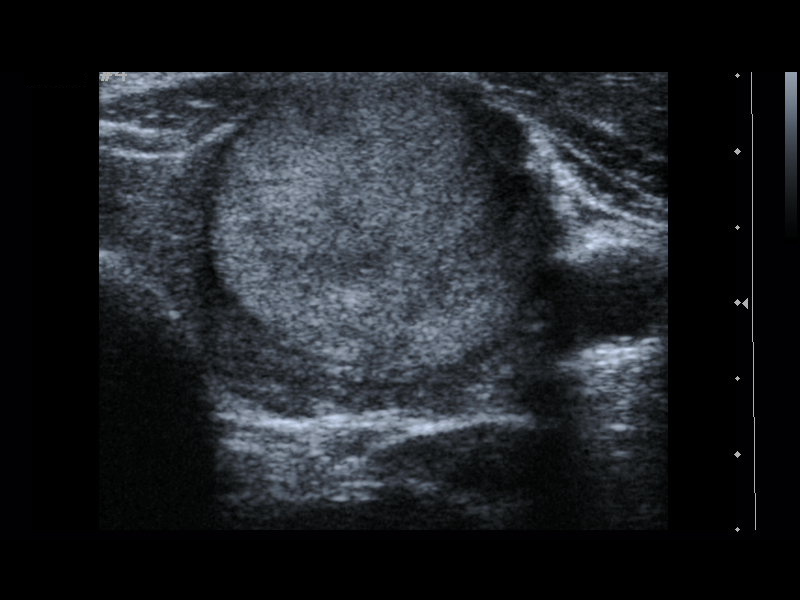
[im 7/7]
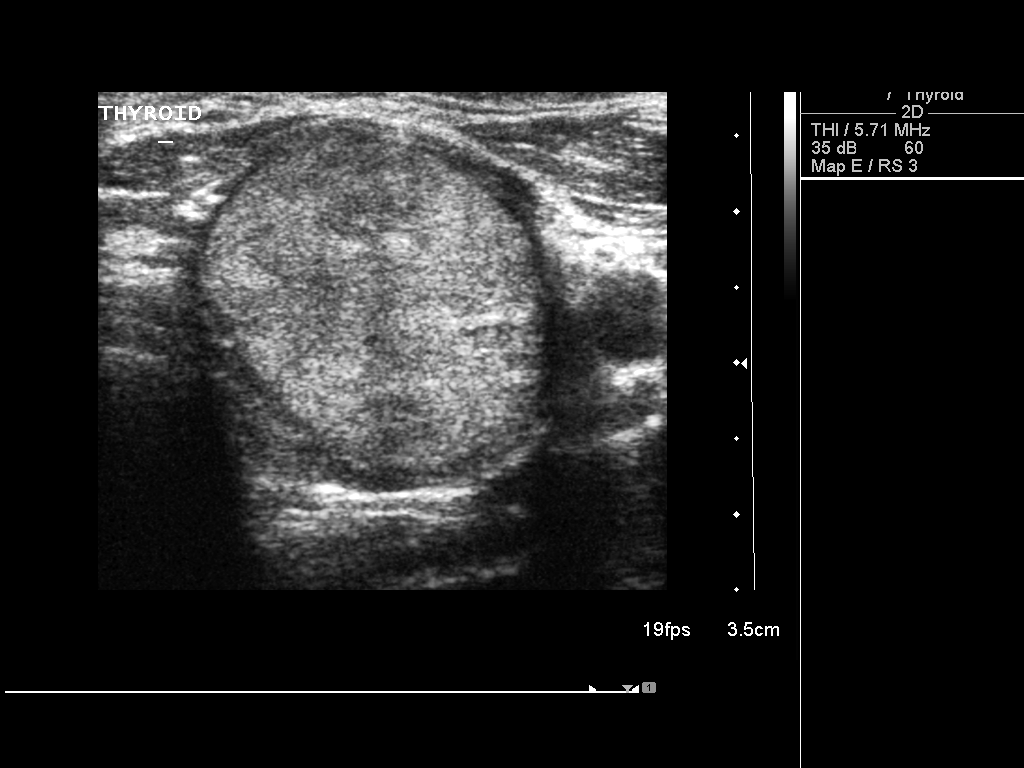

[7 of 7 positions shown; findings below may reference images not displayed]

Thyroid biopsy was thoroughly discussed with the patient and
questions were answered.  The benefits, risks, alternatives, and
complications were also discussed.  The patient understands and
wishes to proceed with the procedure.  Written consent was
obtained.

Ultrasound was performed to localize and mark an adequate site for
the biopsy.  The patient was then prepped and draped in a normal
sterile fashion.  Local anesthesia was provided with 1% lidocaine.
Using direct ultrasound guidance, four passes were made using 25
gauge needles into the nodule within the left lobe of the thyroid.
Ultrasound was used to confirm needle placements on all occasions.
Specimens were sent to Pathology for analysis.

Complications:  None
FINDINGS: Solid left thyroid nodule.
IMPRESSION: Ultrasound guided needle aspirate biopsy performed of the left
thyroid nodule.

## 2014-01-01 ENCOUNTER — Other Ambulatory Visit: Payer: Self-pay | Admitting: Internal Medicine

## 2014-01-01 ENCOUNTER — Ambulatory Visit: Payer: 59 | Admitting: Internal Medicine

## 2014-01-30 ENCOUNTER — Ambulatory Visit: Payer: 59 | Admitting: Internal Medicine

## 2014-10-06 IMAGING — US US THYROID BIOPSY
1 series · 14 of 15 positions shown · non-contrast
Comparison: Ultrasound and biopsy on December 2012

CLINICAL DATA: Left dominant thyroid nodule; previous biopsy
performed December 2012 was read as mild atypia with minimal background
colloid.

EXAM:
ULTRASOUND GUIDED NEEDLE ASPIRATE BIOPSY OF THE THYROID GLAND

[Series 1: us thyroid biopsy · 0.06mm/px · 15 acquisitions, 14 frames shown]
[im 1/15]
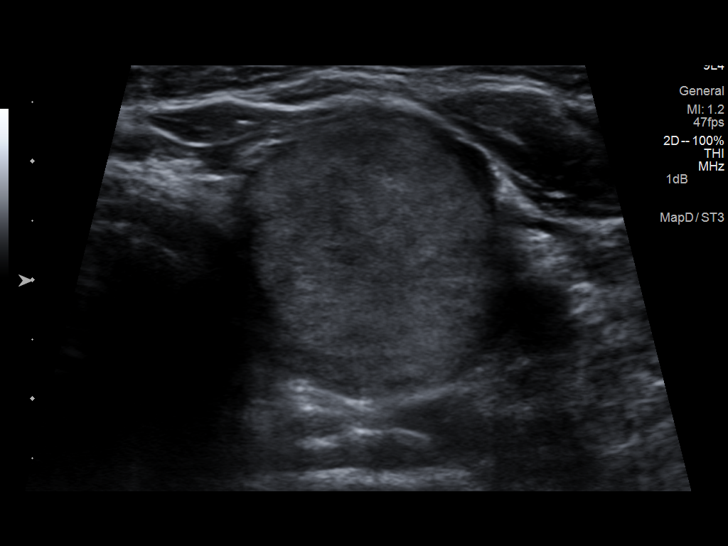
[im 2/15]
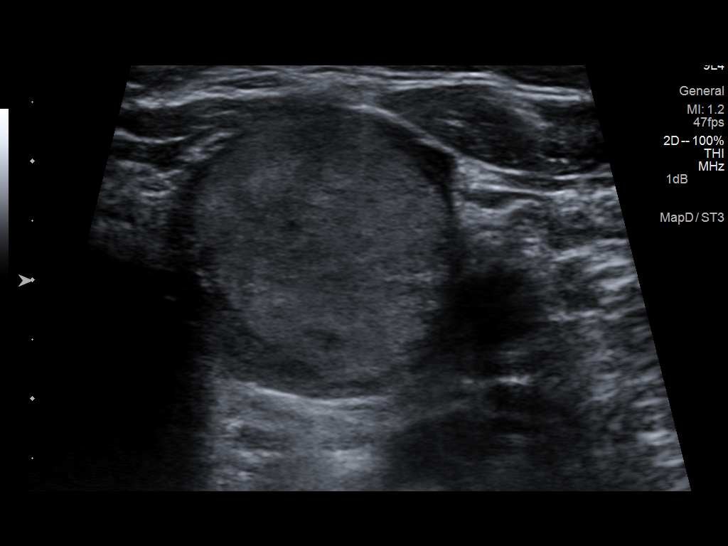
[im 3/15]
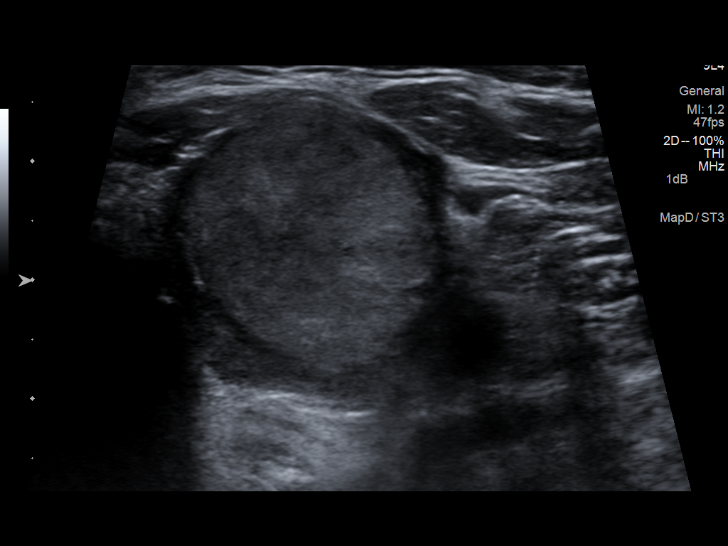
[im 4/15]
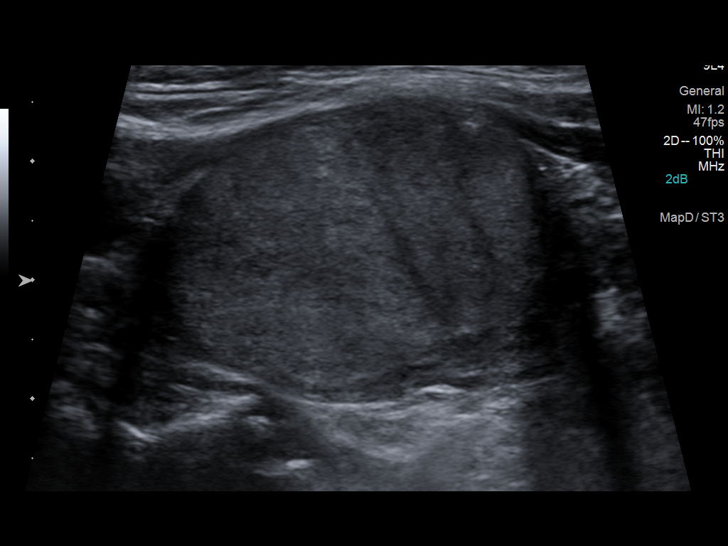
[im 5/15]
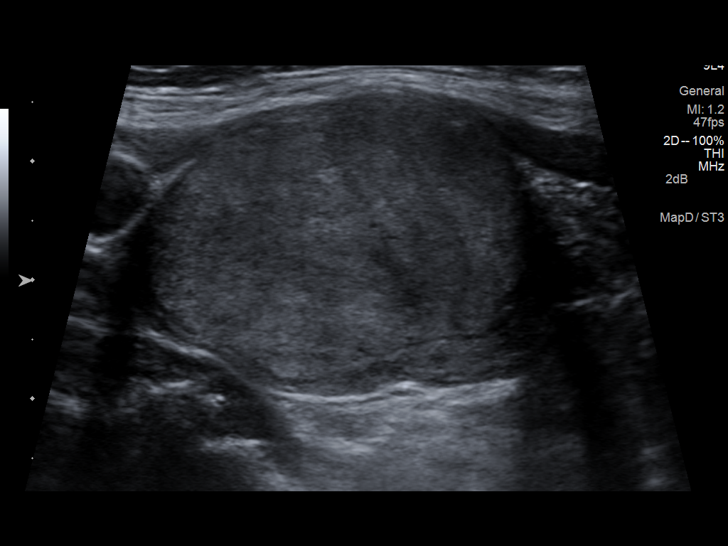
[im 6/15]
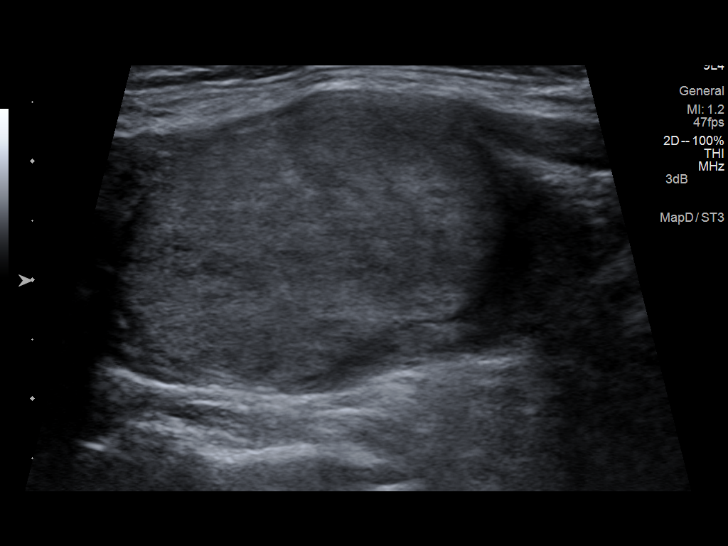
[im 7/15]
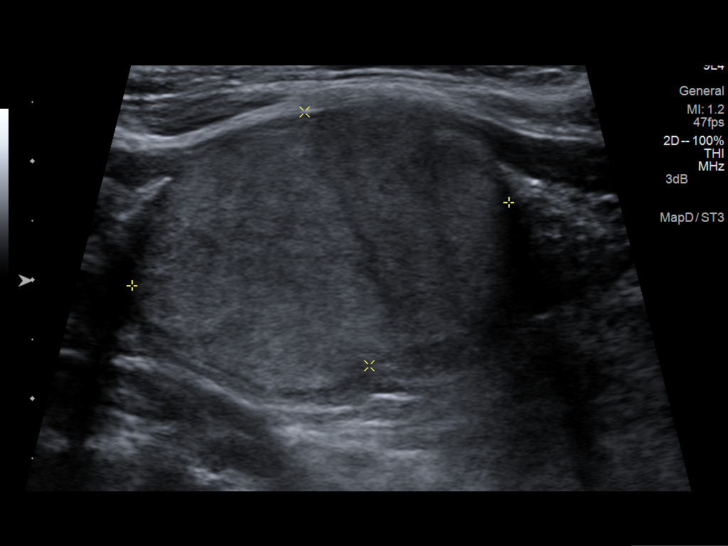
[im 9/15]
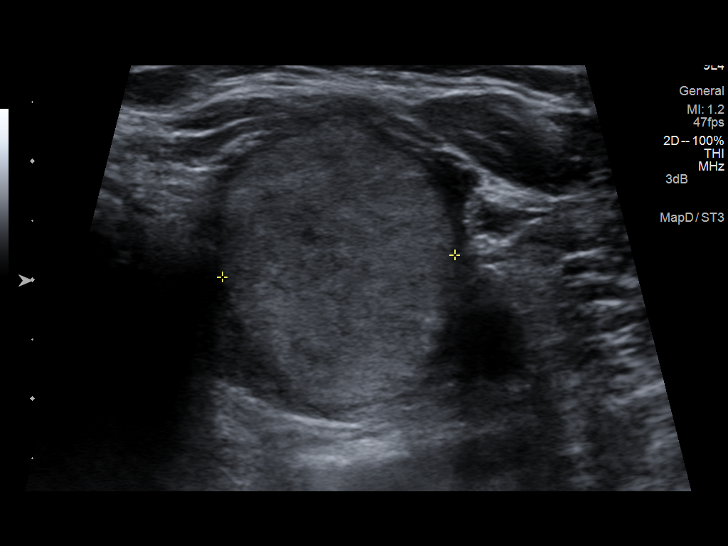
[im 10/15]
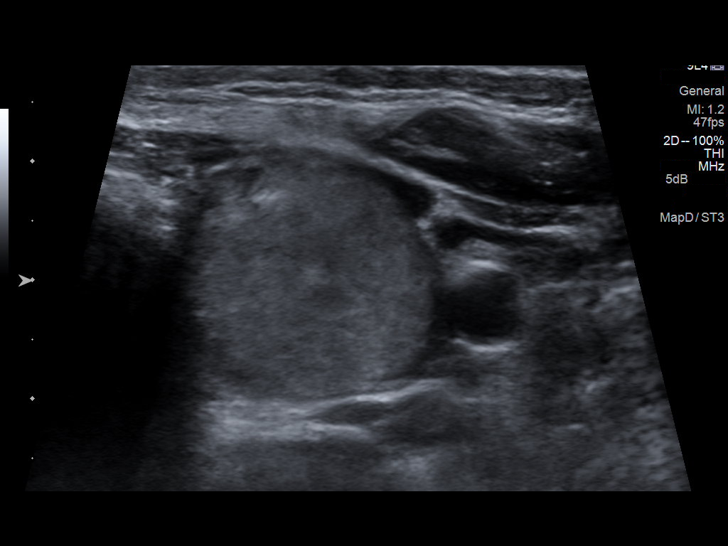
[im 11/15]
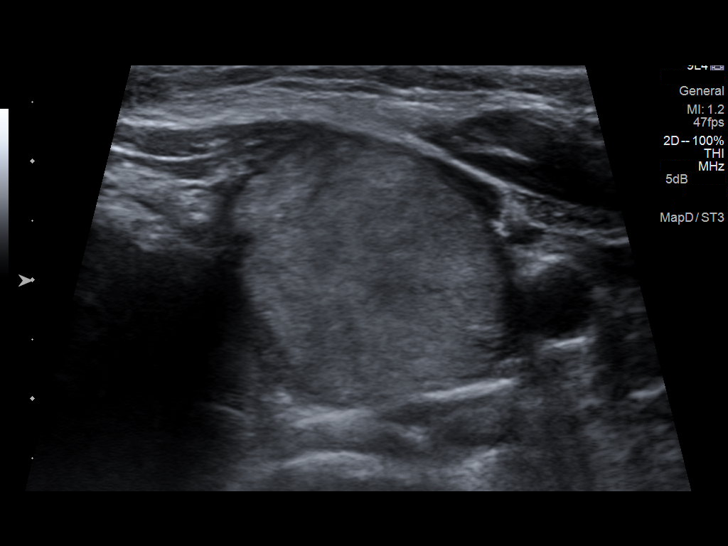
[im 12/15]
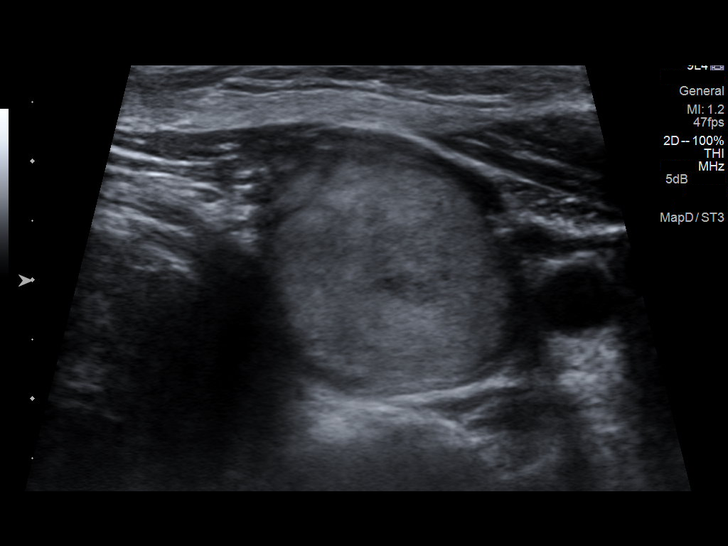
[im 13/15]
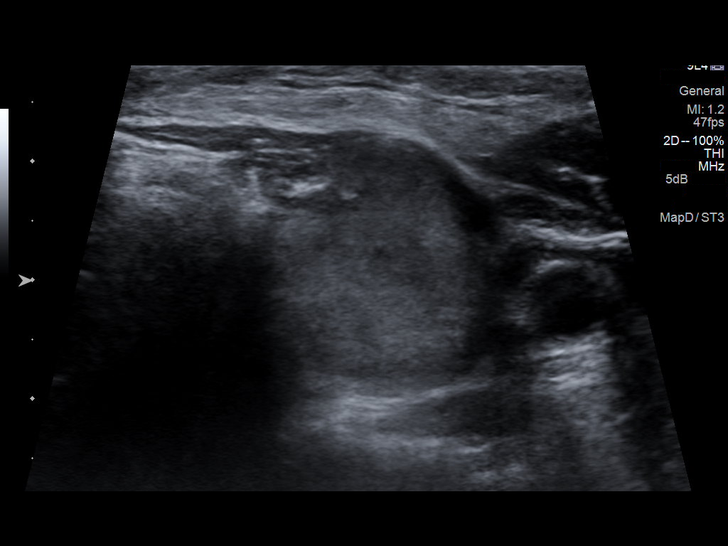
[im 14/15]
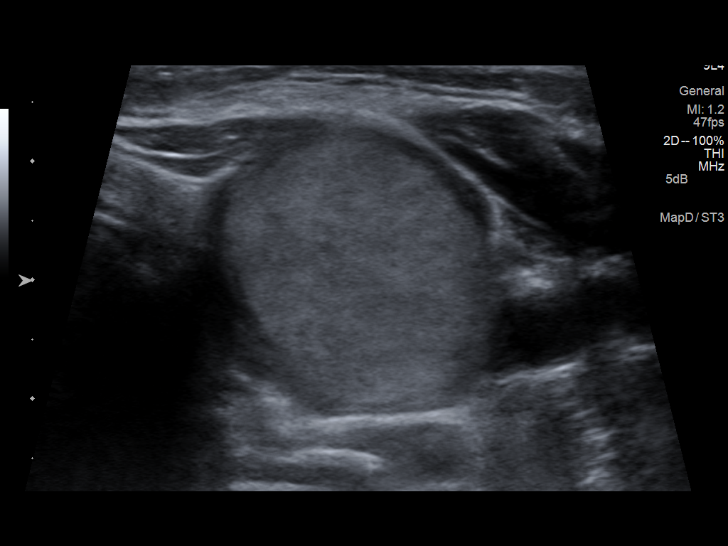
[im 15/15]
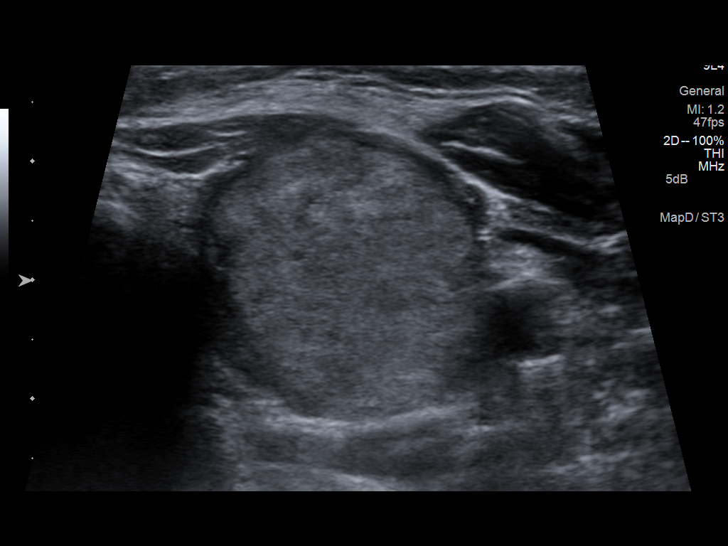

[14 of 15 positions shown; findings below may reference images not displayed]

PROCEDURE:
Thyroid biopsy was thoroughly discussed with the patient and
questions were answered. The benefits, risks, alternatives, and
complications were also discussed. The patient understands and
wishes to proceed with the procedure. Written consent was obtained.



Complications:  None
IMPRESSION: Ultrasound guided needle aspirate biopsy performed of the left
thyroid nodule.

Read by: Alice Oliveira Kuroda

## 2014-10-09 ENCOUNTER — Encounter: Payer: Self-pay | Admitting: Internal Medicine

## 2014-10-09 ENCOUNTER — Ambulatory Visit (INDEPENDENT_AMBULATORY_CARE_PROVIDER_SITE_OTHER): Payer: 59 | Admitting: Internal Medicine

## 2014-10-09 VITALS — BP 110/70 | HR 76 | Ht 65.0 in | Wt 154.2 lb

## 2014-10-09 DIAGNOSIS — K219 Gastro-esophageal reflux disease without esophagitis: Secondary | ICD-10-CM

## 2014-10-09 MED ORDER — PANTOPRAZOLE SODIUM 40 MG PO TBEC: 40.0000 mg | DELAYED_RELEASE_TABLET | Freq: Every day | ORAL | Status: DC

## 2014-10-09 NOTE — Patient Instructions (Addendum)
Today we are putting you in for an EGD/colon recall for December 2016.   We have sent the following medications to your pharmacy for you to pick up at your convenience: Generic Protonix   I appreciate the opportunity to care for you. Dr Cruzita Lederer

## 2014-10-09 NOTE — Progress Notes (Signed)
Cindy Dickson 1950-10-30 [id number]  Note: This dictation was prepared with Dragon digital system. Any transcriptional errors that result from this procedure are unintentional.   History of Present Illness: This is a 64 year old white female with thyroid nodule followed by Dr Cruzita Lederer.. She has a history of esophageal stricture which was dilated in November 2007 from 15-17 mm. And she has a 3 cm hiatal hernia. She has a mild recurrence of dysphagia but  is not interested in dilatation at this time . She wants refill on Protonix 40 mg daily. She also has a history of nonspecific colitis found on colonoscopy in November 2007. She had a friable mucosa in the left colon and biopsies showed crypt distortion and cryptitis consistent with low-grade inflammatory bowel disease. Patient denies having rectal bleeding diarrhea or abdominal pain.    Past Medical History  Diagnosis Date  . Esophageal stricture   . GERD (gastroesophageal reflux disease)   . Ulcerative proctitis   . Hiatal hernia     Past Surgical History  Procedure Laterality Date  . Esophagogastroduodenoscopy    . Colonoscopy    . Cataract extraction Right     Torick lens Implant    No Known Allergies  Family history and social history have been reviewed.  Review of Systems: Positive for intentional weight loss of 10 pounds. Denies abdominal pain diarrhea rectal bleeding  The remainder of the 10 point ROS is negative except as outlined in the H&P  Physical Exam: General Appearance Well developed, in no distress Eyes  Non icteric  HEENT  Non traumatic, normocephalic  Mouth No lesion, tongue papillated, no cheilosis Neck Supple without adenopathy, thyroid not enlarged, no carotid bruits, no JVD Lungs Clear to auscultation bilaterally COR Normal S1, normal S2, regular rhythm, no murmur, quiet precordium Abdomen soft, nontender, normoactive bowel sounds. No distention. Liver edge at costal margin Rectal not  done Extremities  No pedal edema Skin No lesions Neurological Alert and oriented x 3 Psychological Normal mood and affect  Assessment and Plan:   64 year old white female with gastroesophageal reflux disease and hiatal hernia. She had a esophageal stricture dilated in 2007 and may need another dilatation within next year. We will refill Protonix 40 mg daily  Colorectal screening. Question of low-grade inflammatory bowel disease on last colonoscopy. She will be due for recall colonoscopy at the end of this year. We will send her recall letter for Endo and colon for December 2016    Delfin Edis 10/09/2014

## 2014-10-29 ENCOUNTER — Ambulatory Visit: Payer: Self-pay | Admitting: Internal Medicine

## 2014-11-08 ENCOUNTER — Encounter: Payer: Self-pay | Admitting: Internal Medicine

## 2014-11-08 ENCOUNTER — Ambulatory Visit (INDEPENDENT_AMBULATORY_CARE_PROVIDER_SITE_OTHER): Payer: 59 | Admitting: Internal Medicine

## 2014-11-08 VITALS — BP 114/68 | HR 91 | Temp 97.7°F | Resp 12 | Wt 153.6 lb

## 2014-11-08 DIAGNOSIS — E041 Nontoxic single thyroid nodule: Secondary | ICD-10-CM

## 2014-11-08 NOTE — Patient Instructions (Addendum)
You will be called with the schedule for the U/S.  Please return in 2 years.

## 2014-11-08 NOTE — Progress Notes (Addendum)
Subjective:     Patient ID: Cindy Dickson, female   DOB: 23-Apr-1951, 64 y.o.   MRN: [id number]  HPI Ms Low he is a pleasant 64 y.o. woman, initially referred by Dr. Delfin Edis (gastroenterology), for management of a left thyroid nodule. Last visit 12/2012.  Reviewed hx: Patient saw Dr. Olevia Perches for GERD, mild esophageal stricture - status post dilation in 2007, with complete relief of symptoms, hiatal hernia, and ulcerative proctitis. During examination by Dr. Olevia Perches, patient was found to have an enlarged left side of the thyroid, and she was sent for a thyroid ultrasound.   Thyroid U/S (12/07/2012): inhomogeneous echogenicity, and also a large, solitary, solid, mass of 3.1 x 2.5 x 2.1 cm in the left thyroid lobe, without calcifications. There is no internal blood flow. She also had the left cervical lymph node measuring 9 x 5 x 7 mm (characterized as normal).   40/9811: FNA: FLUS (follicular lesion of undetermined significance)  09/2013: Repeat FNA: benign (she had a traumatic experience -[place])  Reviewed TFTs: Lab Results  Component Value Date   TSH 2.80 11/28/2012   The patient denies problems with swallowing (but had strictures), no hoarseness, shortness of breath with lying down.   She also denies heat or cold intolerance, palpitations, weight loss. constipation/hyperdefecation, insomnia/hypersomnia, depression/anxiety, dry skin or hair falling.  She is on Protonix >> weaning herself off now.   Review of Systems Constitutional: + weight loss, no fatigue, no subjective hyperthermia/hypothermia Eyes: no blurry vision, no xerophthalmia ENT: no sore throat, + nodules palpated in throat, no dysphagia/odynophagia, no hoarseness Cardiovascular: no CP/SOB/palpitations/leg swelling Respiratory: no cough/SOB Gastrointestinal: no N/V/D/C, + GERD Musculoskeletal: no muscle/joint aches Skin: no rashes Neurological: no tremors/numbness/tingling/dizziness  I reviewed pt's  medications, allergies, PMH, social hx, family hx, and changes were documented in the history of present illness. Otherwise, unchanged from my initial visit note.  Past Medical History  Diagnosis Date  . Esophageal stricture   . GERD (gastroesophageal reflux disease)   . Ulcerative proctitis   . Hiatal hernia    Past Surgical History  Procedure Laterality Date  . Esophagogastroduodenoscopy    . Colonoscopy    . Cataract extraction Right     Torick lens Implant    History   Social History  . Marital Status: Married    Spouse Name: N/A    Number of Children: 1   Occupational History  . Self-employed   Social History Main Topics  . Smoking status: Never Smoker   . Alcohol Use: No  . Drug Use: No   Social History Narrative   Caffeine daily    Current Outpatient Prescriptions on File Prior to Visit  Medication Sig Dispense Refill  . pantoprazole (PROTONIX) 40 MG tablet Take 1 tablet (40 mg total) by mouth daily. (Patient taking differently: Take 40 mg by mouth 3 (three) times a week. ) 90 tablet 3   No current facility-administered medications on file prior to visit.   No Known Allergies  Family History  Problem Relation Age of Onset  . Colon cancer Neg Hx     Objective:   Physical Exam BP 114/68 mmHg  Pulse 91  Temp(Src) 97.7 F (36.5 C) (Oral)  Resp 12  Wt 153 lb 9.6 oz (69.673 kg)  SpO2 97% Wt Readings from Last 3 Encounters:  11/08/14 153 lb 9.6 oz (69.673 kg)  10/09/14 154 lb 3.2 oz (69.945 kg)  12/15/12 160 lb (72.576 kg)   Constitutional: overweight, in NAD, looking younger  than stated age Eyes: PERRLA, EOMI, no exophthalmos ENT: moist mucous membranes, thyromegaly L>R, no cervical lymphadenopathy Cardiovascular: RRR, No MRG Respiratory: CTA B Gastrointestinal: abdomen soft, NT, ND, BS+ Musculoskeletal: no deformities, strength intact in all 4 Skin: moist, warm, no rashes Neurological: no tremor with outstretched hands, DTR normal in all 4     Assessment:     1. Left thyroid nodule  - 12/07/2012: thyroid ultrasound with 3.1 x 2.5 x 2.1 cm in the left thyroid lobe, without calcifications. There is no internal blood flow. She also had the left cervical lymph node measuring 9 x 5 x 7 mm (with fatty hilum).  - FNA (12/29/2012): Adequacy Reason Satisfactory For Evaluation. Diagnosis THYROID, FINE NEEDLE ASPIRATION, LEFT LOBE: FOLLICULAR LESION OF UNDETERMINED SIGNIFICANCE. SEE COMMENT. COMMENT: THE SPECIMEN IS HYPERCELLULAR AND CONSISTS OF SMALL TO LARGE SIZED SHEETS OF FOLLICULAR EPITHELIAL CELLS WITH MILD CYTOLOGIC ATYPIA. THERE IS MINIMAL BACKGROUND COLLOID. BASED ON THESE FINDINGS, A FOLLICULAR LESION/NEOPLASM CAN NOT BE ENTIRELY RULED OUT. 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 Diagnosis THYROID, FINE NEEDLE ASPIRATION, LEFT (SPECIMEN 1 OF 1, COLLECTED ON 10/06/13): BENIGN. FINDINGS CONSISTENT WITH NON-NEOPLASTIC GOITER. Willeen Niece MD Pathologist, Electronic Signature (Case signed 10/09/2013)     Plan:     - Pt with a large thyroid nodule, without neck compression sxs. - We Bx'ed the nodule 2x: FLUS, initially, then benign - we continue to follow it clinically and will also obtain a thyroid U/S today - I'll see her back in 2 years, when we likely need to repeat the U/S and maybe the FNA - she has needle phobia and is reluctant to have TFTs today >> I advised her to have this done at annual physical appt with PCP >> advised to get one - I advised her to join my chart and I will send her the results through there   Orders Placed This Encounter  Procedures  . US Soft Tissue Head/Neck   I will addend the results when they become  available.  CLINICAL DATA: 64 year old female with left thyroid nodule which was previously biopsied on 12/29/2012.  EXAM: THYROID ULTRASOUND  TECHNIQUE: Ultrasound examination of the thyroid gland and adjacent soft tissues was performed.  COMPARISON: Prior thyroid ultrasound 12/06/2012  FINDINGS: Right thyroid lobe  Measurements: 3.9 x 1.8 x 1.7 cm. Diffusely heterogeneous right thyroid lobe. No discrete nodule.  Left thyroid lobe  Measurements: 3.9 x 2.1 x 2.2 cm. Similar appearance of large solid and slightly hyperechoic mass occupying the majority of the mid and upper gland. Today the mass measures 3.3 x 2.3 x 2.1 cm compared to 3.1 x 2.5 x 2.1 cm previously. This subtle difference is within measurement error and not felt to be clinically significant.  Isthmus  Thickness: 0.2 cm. Two small hypoechoic nodules are present within the isthmus measuring 5 and 6 mm respectively. These are not discretely measured on the prior study, however in retrospect they appear unchanged.  Lymphadenopathy  No suspicious adenopathy.  IMPRESSION: No significant interval change in the dominant solid hyperechoic nodule occupying the majority of the left thyroid gland.   Electronically Signed By: Jacqulynn Cadet M.D. On: 11/27/2014 15:54

## 2014-11-27 ENCOUNTER — Ambulatory Visit
Admission: RE | Admit: 2014-11-27 | Discharge: 2014-11-27 | Disposition: A | Discharge status: Home / Self Care | Payer: 59 | Source: Ambulatory Visit | Attending: Internal Medicine | Admitting: Internal Medicine

## 2014-11-27 NOTE — Addendum Note (Signed)
Addended by: Philemon Kingdom on: 11/27/2014 05:01 PM   Modules accepted: Level of Service

## 2014-11-30 ENCOUNTER — Encounter: Payer: Self-pay | Admitting: *Deleted

## 2015-07-17 ENCOUNTER — Telehealth: Payer: Self-pay | Admitting: Gastroenterology

## 2015-07-17 NOTE — Telephone Encounter (Signed)
Dr Silverio Decamp would want to see her prior to EGD. If she is having problems, I can schedule her with an extender. Otherwise, just the recall colon.

## 2015-07-22 NOTE — Telephone Encounter (Signed)
OV scheduled.  °

## 2015-07-24 ENCOUNTER — Ambulatory Visit (INDEPENDENT_AMBULATORY_CARE_PROVIDER_SITE_OTHER): Payer: Commercial Managed Care - HMO | Admitting: Gastroenterology

## 2015-07-24 ENCOUNTER — Encounter: Payer: Self-pay | Admitting: Gastroenterology

## 2015-07-24 VITALS — BP 110/76 | HR 80 | Ht 65.0 in | Wt 157.2 lb

## 2015-07-24 DIAGNOSIS — K5120 Ulcerative (chronic) proctitis without complications: Secondary | ICD-10-CM | POA: Diagnosis not present

## 2015-07-24 DIAGNOSIS — R1310 Dysphagia, unspecified: Secondary | ICD-10-CM

## 2015-07-24 DIAGNOSIS — K222 Esophageal obstruction: Secondary | ICD-10-CM | POA: Diagnosis not present

## 2015-07-24 NOTE — Patient Instructions (Signed)
You have been scheduled for an endoscopy. Please follow written instructions given to you at your visit today. If you use inhalers (even only as needed), please bring them with you on the day of your procedure. Your physician has requested that you go to www.[email or web address].com and enter the access code given to you at your visit today. This web site gives a general overview about your procedure. However, you should still follow specific instructions given to you by our office regarding your preparation for the procedure.  Your follow up appointment is with Dr Silverio Decamp on 09/19/2015 at 9:45am

## 2015-07-24 NOTE — Progress Notes (Signed)
Cindy Dickson    [id number]    Oct 31, 1950  Primary Care Physician:GHERGHE, Salena Saner, MD  Referring Physician: Philemon Kingdom, MD [address] [place], [place] [address]  Chief complaint:  Dysphagia   HPI:  65 year old female with history of esophageal stricture here with complaints of difficulty swallowing and food impaction. She has had 3 or 4 episodes in the past 1 month where she choked on food( chicken twice, peach and a piece of broccoli once), she was able to regurgitate the food back up, did not have to come to emergency room or have an emergent EGD. Her last EGD with dilation with savory dilators 15,16 and 17 mm was in 2007. She hasn't had any issue with swallowing until about 3 or 4 months ago. She is taking intermittent PPI, denies any odynophagia. Her weight has been stable. Her last colonoscopy was in 2007, showed mild chronic active colitis in the rectum. 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 She had amnesia, nausea / vomiting post sedation in 2007 and she has phobia of needles and had difficulty with placement of IV access during the procedure . She would prefer to wait undergoing colonoscopy until later this year  Outpatient Encounter Prescriptions as of 07/24/2015  Medication Sig  . DUREZOL 0.05 % EMUL   . pantoprazole (PROTONIX) 40 MG tablet Take 1 tablet (40 mg total) by mouth daily. (Patient taking differently: Take 40 mg by mouth 3 (three) times a week. )  . VIGAMOX 0.5 % ophthalmic solution    No facility-administered encounter medications on file as of 07/24/2015.    Allergies as of 07/24/2015  . (No Known Allergies)    Past Medical History  Diagnosis Date  . Esophageal stricture   . GERD (gastroesophageal reflux disease)   . Ulcerative proctitis ([place])   . Hiatal hernia     Past Surgical History  Procedure Laterality Date  . Esophagogastroduodenoscopy    . Colonoscopy    . Cataract  extraction Bilateral     Torick lens Implant    Family History  Problem Relation Age of Onset  . Colon cancer Neg Hx   . Stomach cancer Maternal Grandmother     Social History   Social History  . Marital Status: Married    Spouse Name: N/A  . Number of Children: N/A  . Years of Education: N/A   Occupational History  . real estate    Social History Main Topics  . Smoking status: Never Smoker   . Smokeless tobacco: Never Used  . Alcohol Use: No  . Drug Use: No  . Sexual Activity: Not on file   Other Topics Concern  . Not on file   Social History Narrative   Caffeine daily       Review of systems: Review of Systems  Constitutional: Negative for fever and chills.  HENT: Negative.   Eyes: Negative for blurred vision.  Respiratory: Negative for cough, shortness of breath and wheezing.   Cardiovascular: Negative for chest pain and palpitations.  Gastrointestinal: as per HPI Genitourinary: Negative for dysuria, urgency, frequency and hematuria.  Musculoskeletal: Negative for myalgias, back pain and joint pain.  Skin: Negative for itching and rash.  Neurological: Negative for dizziness, tremors, focal weakness, seizures and loss of consciousness.  Endo/Heme/Allergies: Negative for environmental allergies.  Psychiatric/Behavioral: Negative for depression, suicidal ideas and hallucinations.  All other systems reviewed and are negative.  Physical Exam: Filed Vitals:   07/24/15 0852  BP: 110/76  Pulse: 80   Gen:      No acute distress HEENT:  EOMI, sclera anicteric Neck:     No masses; no thyromegaly Lungs:    Clear to auscultation bilaterally; normal respiratory effort CV:         Regular rate and rhythm; no murmurs Abd:      + bowel sounds; soft, non-tender; no palpable masses, no distension Ext:    No edema; adequate peripheral perfusion Skin:      Warm and dry; no rash Neuro: alert and oriented x 3 Psych: normal mood and affect  Data Reviewed:  EGD and  colonoscopy 2007  . GASTROESOPHAGEAL JUNCTION, BIOPSIES: MILDLY INFLAMED GASTROESOPHAGEAL JUNCTION MUCOSA. NO INTESTINAL METAPLASIA, FUNGI, DYSPLASIA, OR CARCINOMA IDENTIFIED.  2. COLON, RECTOSIGMOID, BIOPSIES: BENIGN COLONIC MUCOSA WITH SMALL BENIGN LYMPHOID AGGREGATES. NO ACTIVE INFLAMMATION, CHRONIC CHANGES, OR GRANULOMAS.  3. COLON, RECTUM, BIOPSIES: CHRONIC ACTIVE COLITIS WITH ENLARGED LYMPHOID AGGREGATE.   Assessment and Plan/Recommendations:  65 year old female with history of ulcerative proctitis and GERD with esophageal stricture status post dilation to 17 mm in 2007 here with complaints of worsening dysphagia and few episodes of possible food impaction We'll schedule for EGD with possible dilation Continue PPI Advised patient to avoid meat, bread and raw fruits /vegetables; cut food into small pieces and chew well Ulcerative proctitis: Patient is currently asymptomatic and not on any maintenance therapy She is due for screening colonoscopy, she would like to schedule it later Return in 2-4 weeks after EGD  K. Denzil Magnuson , MD [phone] Mon-Fri 8a-5p [phone] after 5p, weekends, holidays

## 2015-07-26 ENCOUNTER — Encounter: Payer: Self-pay | Admitting: Gastroenterology

## 2015-07-26 ENCOUNTER — Ambulatory Visit (AMBULATORY_SURGERY_CENTER): Payer: Commercial Managed Care - HMO | Admitting: Gastroenterology

## 2015-07-26 VITALS — BP 120/62 | HR 78 | Temp 98.9°F | Resp 17 | Ht 65.0 in | Wt 157.0 lb

## 2015-07-26 DIAGNOSIS — K2970 Gastritis, unspecified, without bleeding: Secondary | ICD-10-CM

## 2015-07-26 DIAGNOSIS — K269 Duodenal ulcer, unspecified as acute or chronic, without hemorrhage or perforation: Secondary | ICD-10-CM

## 2015-07-26 DIAGNOSIS — R1310 Dysphagia, unspecified: Secondary | ICD-10-CM | POA: Diagnosis present

## 2015-07-26 DIAGNOSIS — K2990 Gastroduodenitis, unspecified, without bleeding: Secondary | ICD-10-CM | POA: Diagnosis not present

## 2015-07-26 DIAGNOSIS — K222 Esophageal obstruction: Secondary | ICD-10-CM | POA: Diagnosis not present

## 2015-07-26 MED ORDER — SODIUM CHLORIDE 0.9 % IV SOLN: 500.0000 mL | INTRAVENOUS | Status: DC

## 2015-07-26 NOTE — Patient Instructions (Signed)
YOU HAD AN ENDOSCOPIC PROCEDURE TODAY AT [facility]:   Refer to the procedure report that was given to you for any specific questions about what was found during the examination.  If the procedure report does not answer your questions, please call your gastroenterologist to clarify.  If you requested that your care partner not be given the details of your procedure findings, then the procedure report has been included in a sealed envelope for you to review at your convenience later.  YOU SHOULD EXPECT: Some feelings of bloating in the abdomen. Passage of more gas than usual.  Walking can help get rid of the air that was put into your GI tract during the procedure and reduce the bloating. If you had a lower endoscopy (such as a colonoscopy or flexible sigmoidoscopy) you may notice spotting of blood in your stool or on the toilet paper. If you underwent a bowel prep for your procedure, you may not have a normal bowel movement for a few days.  Please Note:  You might notice some irritation and congestion in your nose or some drainage.  This is from the oxygen used during your procedure.  There is no need for concern and it should clear up in a day or so.  SYMPTOMS TO REPORT IMMEDIATELY:   Following upper endoscopy (EGD)  Vomiting of blood or coffee ground material  New chest pain or pain under the shoulder blades  Painful or persistently difficult swallowing  New shortness of breath  Fever of 100F or higher  Black, tarry-looking stools  For urgent or emergent issues, a gastroenterologist can be reached at any hour by calling [phone].   DIET: Your first meal following the procedure should be a small meal and then it is ok to progress to your normal diet. Heavy or fried foods are harder to digest and may make you feel nauseous or bloated.  Likewise, meals heavy in dairy and vegetables can increase bloating.  Drink plenty of fluids but you should avoid alcoholic beverages for  24 hours.  ACTIVITY:  You should plan to take it easy for the rest of today and you should NOT DRIVE or use heavy machinery until tomorrow (because of the sedation medicines used during the test).    FOLLOW UP: Our staff will call the number listed on your records the next business day following your procedure to check on you and address any questions or concerns that you may have regarding the information given to you following your procedure. If we do not reach you, we will leave a message.  However, if you are feeling well and you are not experiencing any problems, there is no need to return our call.  We will assume that you have returned to your regular daily activities without incident.  If any biopsies were taken you will be contacted by phone or by letter within the next 1-3 weeks.  Please call us at [phone] if you have not heard about the biopsies in 3 weeks.    SIGNATURES/CONFIDENTIALITY: You and/or your care partner have signed paperwork which will be entered into your electronic medical record.  These signatures attest to the fact that that the information above on your After Visit Summary has been reviewed and is understood.  Full responsibility of the confidentiality of this discharge information lies with you and/or your care-partner.  Follow dilation diet today, resume normal diet tomorrow Continue Protonix daily Await biopsy results  follow up for office visit

## 2015-07-26 NOTE — Progress Notes (Signed)
Called to room to assist during endoscopic procedure.  Patient ID and intended procedure confirmed with present staff. Received instructions for my participation in the procedure from the performing physician.  

## 2015-07-26 NOTE — Progress Notes (Addendum)
No egg or soy allergy known to patient  No issues with past sedation with any surgeries  or procedures, no intubation problems --hx nausea /vomiting with sedation No diet pills per patient  No home 02 use per patient

## 2015-07-26 NOTE — Op Note (Signed)
[facility]  [address] [place], [id number]   ENDOSCOPY PROCEDURE REPORT  PATIENT: Cindy Dickson, Cindy Dickson  MR#: [id number] BIRTHDATE: 1951/03/25 , 18  yrs. old GENDER: female ENDOSCOPIST: Harl Bowie, MD REFERRED BY:   Philemon Kingdom MD PROCEDURE DATE:  07/26/2015 PROCEDURE:  EGD w/ biopsy and EGD w/ balloon dilation ASA CLASS:     Class II INDICATIONS:  dysphagia. MEDICATIONS: Propofol 350 mg IV TOPICAL ANESTHETIC: none  DESCRIPTION OF PROCEDURE: After the risks benefits and alternatives of the procedure were thoroughly explained, informed consent was obtained.  The LB [id number] endoscope was introduced through the mouth and advanced to the second portion of the duodenum , Without limitations.  The instrument was slowly withdrawn as the mucosa was fully examined.   Mild stricture in the distal esophagus at 33cm associated with mild tortousity of the esophagus proximal to the stricture.  TTS balloon dilation performed 18-19-20mm with minimal amount of heme.  Large Hiatal hernia  7cm.  Mild antral erythema s/p random gastric biopsies.  Small duodenal clean based ulcer  1-58mm s/p random duodenal biopsies.  Retroflexed views revealed a hiatal hernia. The scope was then withdrawn from the patient and the procedure completed.  COMPLICATIONS: There were no immediate complications.  ENDOSCOPIC IMPRESSION: Mild stricture in the distal esophagus at 33cm associated with mild tortousity of the esophagus proximal to the stricture.  TTS balloon dilation performed 18-19-20mm with minimal amount of heme.  Large Hiatal hernia  7cm.  Mild antral erythema s/p random gastric biopsies.  Small duodenal clean based ulcer  1-46mm s/p random duodenal biopsies  RECOMMENDATIONS: 1.  Continue PPI 2.  Anti-reflux regimen to be follow 3.  Await biopsy results 4. Follow up in office visit    eSigned:  Harl Bowie, MD 07/26/2015 2:33 PM

## 2015-07-26 NOTE — Progress Notes (Signed)
To recovery, report to Smith, RN, VSS 

## 2015-07-29 ENCOUNTER — Telehealth: Payer: Self-pay

## 2015-07-29 NOTE — Telephone Encounter (Signed)
  Follow up Call-  Call back number 07/26/2015  Post procedure Call Back phone  # [phone]  Permission to leave phone message Yes     Patient questions:  Do you have a fever, pain , or abdominal swelling? No. Pain Score  0 *  Have you tolerated food without any problems? Yes.    Have you been able to return to your normal activities? Yes.    Do you have any questions about your discharge instructions: Diet   No. Medications  No. Follow up visit  No.  Do you have questions or concerns about your Care? No.  Actions: * If pain score is 4 or above: No action needed, pain <4.

## 2015-08-05 ENCOUNTER — Encounter: Payer: Self-pay | Admitting: Gastroenterology

## 2015-09-19 ENCOUNTER — Encounter: Payer: Self-pay | Admitting: Gastroenterology

## 2015-09-19 ENCOUNTER — Ambulatory Visit: Payer: Commercial Managed Care - HMO | Admitting: Gastroenterology

## 2015-09-19 ENCOUNTER — Ambulatory Visit (INDEPENDENT_AMBULATORY_CARE_PROVIDER_SITE_OTHER): Payer: Commercial Managed Care - HMO | Admitting: Gastroenterology

## 2015-09-19 VITALS — BP 100/70 | HR 80 | Ht 64.5 in | Wt 156.4 lb

## 2015-09-19 DIAGNOSIS — K219 Gastro-esophageal reflux disease without esophagitis: Secondary | ICD-10-CM | POA: Diagnosis not present

## 2015-09-19 DIAGNOSIS — K449 Diaphragmatic hernia without obstruction or gangrene: Secondary | ICD-10-CM

## 2015-09-19 MED ORDER — PANTOPRAZOLE SODIUM 40 MG PO TBEC: 40.0000 mg | DELAYED_RELEASE_TABLET | Freq: Every day | ORAL | Status: DC

## 2015-09-19 NOTE — Progress Notes (Signed)
Cindy Dickson    [id number]    1950/11/15  Primary Care Physician:GHERGHE, Salena Saner, MD  Referring Physician: Philemon Kingdom, MD [address] [place], [place] [address]  Chief complaint:  GERD, Esophageal stricture  HPI: 65 yr F with h/o chronic GERD here for follow up visit after EGD. Patient reports significant improvement in dysphagia status post esophageal dilation. She is taking PPI with no significant breakthrough symptoms but if she skips a dose, has significant heartburn, belching and regurgitation. Denies any dysphagia or odynophagia. She is intentionally trying to lose weight. She is due for screening colonoscopy in November 2017.    Outpatient Encounter Prescriptions as of 65/03/2016  Medication Sig  . HONEY PO Take by mouth daily. 1 tsp honey with a sprinkle of cinnamon once a day  . Multiple Vitamins-Minerals (ADULT GUMMY) CHEW Chew 1 Units by mouth daily.  . pantoprazole (PROTONIX) 40 MG tablet Take 1 tablet (40 mg total) by mouth daily.  . [DISCONTINUED] pantoprazole (PROTONIX) 40 MG tablet Take 1 tablet (40 mg total) by mouth daily. (Patient taking differently: Take 40 mg by mouth 3 (three) times a week. )   No facility-administered encounter medications on file as of 09/19/2015.    Allergies as of 09/19/2015  . (No Known Allergies)    Past Medical History  Diagnosis Date  . Esophageal stricture   . GERD (gastroesophageal reflux disease)   . Ulcerative proctitis ([place])   . Hiatal hernia   . Allergy     seasonal  . Arthritis     knee right  . Cataract   . History of artificial lens replacement     bilateral    Past Surgical History  Procedure Laterality Date  . Esophagogastroduodenoscopy    . Colonoscopy    . Cataract extraction Bilateral     Torick lens Implant    Family History  Problem Relation Age of Onset  . Colon cancer Neg Hx   . Esophageal cancer Neg Hx   . Rectal cancer Neg Hx   . Stomach cancer  Maternal Grandmother   . Colon polyps Mother     Social History   Social History  . Marital Status: Married    Spouse Name: N/A  . Number of Children: N/A  . Years of Education: N/A   Occupational History  . real estate    Social History Main Topics  . Smoking status: Never Smoker   . Smokeless tobacco: Never Used  . Alcohol Use: No  . Drug Use: No  . Sexual Activity: Not on file   Other Topics Concern  . Not on file   Social History Narrative   Caffeine daily       Review of systems: Review of Systems  Constitutional: Negative for fever and chills.  HENT: Negative.   Eyes: Negative for blurred vision.  Respiratory: Negative for cough, shortness of breath and wheezing.   Cardiovascular: Negative for chest pain and palpitations.  Gastrointestinal: as per HPI Genitourinary: Negative for dysuria, urgency, frequency and hematuria.  Musculoskeletal: Negative for myalgias, back pain and joint pain.  Skin: Negative for itching and rash.  Neurological: Negative for dizziness, tremors, focal weakness, seizures and loss of consciousness.  Endo/Heme/Allergies: Negative for environmental allergies.  Psychiatric/Behavioral: Negative for depression, suicidal ideas and hallucinations.  All other systems reviewed and are negative.   Physical Exam: Filed Vitals:   09/19/15 0940  BP: 100/70  Pulse: 80  Gen:      No acute distress HEENT:  EOMI, sclera anicteric Neck:     No masses; no thyromegaly Lungs:    Clear to auscultation bilaterally; normal respiratory effort CV:         Regular rate and rhythm; no murmurs Abd:      + bowel sounds; soft, non-tender; no palpable masses, no distension Ext:    No edema; adequate peripheral perfusion Skin:      Warm and dry; no rash Neuro: alert and oriented x 3 Psych: normal mood and affect  Data Reviewed: EGD 07/26/2015 Mild stricture in the distal esophagus at 33cm associated with mild tortousity of the esophagus proximal to the  stricture. TTS balloon dilation performed 18-19-20mm with minimal amount of heme. Large Hiatal hernia ~7cm. Mild antral erythema s/p random gastric biopsies. Small duodenal clean based ulcer ~1-39mm s/p random duodenal biopsies 1. Surgical [P], duodenal bulb and 2nd portion of suodenum - BENIGN DUODENAL MUCOSA WITH FEATURES OF PEPTIC INJURY. - NO FEATURES OF SPRUE, ACTIVE INFLAMMATION OR GRANULOMAS. 2. Surgical [P], gastric antrum and gastric body - SLIGHT CHRONIC GASTRITIS. - WARTHIN-STARRY STAIN NEGATIVE FOR HELICOBACTER PYLORI. - NO INTESTINAL METAPLASIA, DYSPLASIA, OR MALIGNANCY.  Assessment and Plan/Recommendations:  65 year old female with chronic GERD and hiatal hernia here for follow-up visit after recent EGD with esophageal stricture status post dilation Continue PPI daily and antireflux measures Due for recall screening colonoscopy November 2017 Return in 1 year for follow-up  K. Denzil Magnuson , MD [phone] Mon-Fri 8a-5p [phone] after 5p, weekends, holidays

## 2015-09-19 NOTE — Patient Instructions (Addendum)
We will refill your Protonix  Your recall colonoscopy is in November 2017, we will send out a reminder letter Follow up in 1 year

## 2015-10-17 ENCOUNTER — Encounter: Payer: Self-pay | Admitting: Internal Medicine

## 2015-10-17 ENCOUNTER — Ambulatory Visit (INDEPENDENT_AMBULATORY_CARE_PROVIDER_SITE_OTHER): Payer: Commercial Managed Care - HMO | Admitting: Internal Medicine

## 2015-10-17 VITALS — BP 122/64 | HR 80 | Temp 98.2°F | Resp 12 | Wt 155.0 lb

## 2015-10-17 DIAGNOSIS — E041 Nontoxic single thyroid nodule: Secondary | ICD-10-CM

## 2015-10-17 DIAGNOSIS — R946 Abnormal results of thyroid function studies: Secondary | ICD-10-CM | POA: Diagnosis not present

## 2015-10-17 DIAGNOSIS — R7989 Other specified abnormal findings of blood chemistry: Secondary | ICD-10-CM

## 2015-10-17 LAB — T3, FREE: T3 FREE: 3.8 pg/mL (ref 2.3–4.2)

## 2015-10-17 LAB — TSH: TSH: 5.47 u[IU]/mL — AB (ref 0.35–4.50)

## 2015-10-17 LAB — T4, FREE: Free T4: 0.77 ng/dL (ref 0.60–1.60)

## 2015-10-17 NOTE — Patient Instructions (Signed)
Please stop at the lab.  Please come back for a follow-up appointment in 6 months.  

## 2015-10-17 NOTE — Progress Notes (Signed)
Subjective:     Patient ID: Cindy Dickson, female   DOB: 1951-04-05, 65 y.o.   MRN: [id number]  HPI Cindy Dickson he is a pleasant 65 y.o. woman, initially referred by Dr. Delfin Edis (gastroenterology), for management of a left thyroid nodule. Last visit 1 year ago.  Reviewed and addended hx: Patient saw Dr. Olevia Perches for GERD, mild esophageal stricture - status post dilation in 2007, with complete relief of symptoms, hiatal hernia, and ulcerative proctitis. During examination by Dr. Olevia Perches, patient was found to have an enlarged left side of the thyroid, and she was sent for a thyroid ultrasound.   Thyroid U/S (12/07/2012): inhomogeneous echogenicity, and also a large, solitary, solid, mass of 3.1 x 2.5 x 2.1 cm in the left thyroid lobe, without calcifications. There is no internal blood flow. She also had the left cervical lymph node measuring 9 x 5 x 7 mm (characterized as normal).   99991111: FNA: FLUS (follicular lesion of undetermined significance)  09/2013: Repeat FNA: benign (she had a traumatic experience -[place])  Thyroid ultrasound (11/27/2014): Stable left thyroid nodule  Reviewed TFTs: 10/09/2015: (Dr. [name]) - TSH 6.99  Lab Results  Component Value Date   TSH 2.80 11/28/2012   The patient denies problems with swallowing (but had strictures - last stretching few mo ago), no hoarseness, shortness of breath with lying down.   She also c/o: - fatigue in last year - thin hair - brain fog - cold intolerance (chills) - slight weight gain - dystymia  No: - constipation  Review of Systems Constitutional: See HPI Eyes: no blurry vision, no xerophthalmia ENT: no sore throat, + nodules palpated in throat, no dysphagia/odynophagia, no hoarseness Cardiovascular: no CP/SOB/palpitations/leg swelling Respiratory: no cough/SOB Gastrointestinal: no N/V/D/C, + GERD Musculoskeletal: no muscle/joint aches Skin: no rashes Neurological: no  tremors/numbness/tingling/dizziness  I reviewed pt's medications, allergies, PMH, social hx, family hx, and changes were documented in the history of present illness. Otherwise, unchanged from my initial visit note.  Past Medical History  Diagnosis Date  . Esophageal stricture   . GERD (gastroesophageal reflux disease)   . Ulcerative proctitis ([place])   . Hiatal hernia   . Allergy     seasonal  . Arthritis     knee right  . Cataract   . History of artificial lens replacement     bilateral   Past Surgical History  Procedure Laterality Date  . Esophagogastroduodenoscopy    . Colonoscopy    . Cataract extraction Bilateral     Torick lens Implant    History   Social History  . Marital Status: Married    Spouse Name: N/A    Number of Children: 1   Occupational History  . Self-employed   Social History Main Topics  . Smoking status: Never Smoker   . Alcohol Use: No  . Drug Use: No   Social History Narrative   Caffeine daily    Current Outpatient Prescriptions on File Prior to Visit  Medication Sig Dispense Refill  . HONEY PO Take by mouth daily. 1 tsp honey with a sprinkle of cinnamon once a day    . Multiple Vitamins-Minerals (ADULT GUMMY) CHEW Chew 1 Units by mouth daily.    . pantoprazole (PROTONIX) 40 MG tablet Take 1 tablet (40 mg total) by mouth daily. 90 tablet 3   No current facility-administered medications on file prior to visit.   No Known Allergies  Family History  Problem Relation Age of Onset  . Colon cancer  Neg Hx   . Esophageal cancer Neg Hx   . Rectal cancer Neg Hx   . Stomach cancer Maternal Grandmother   . Colon polyps Mother     Objective:   Physical Exam BP 122/64 mmHg  Pulse 80  Temp(Src) 98.2 F (36.8 C) (Oral)  Resp 12  Wt 155 lb (70.308 kg)  SpO2 94% Body mass index is 26.2 kg/(m^2). Wt Readings from Last 3 Encounters:  10/17/15 155 lb (70.308 kg)  09/19/15 156 lb 6 oz (70.931 kg)  07/26/15 157 lb (71.215 kg)    Constitutional: overweight, in NAD, looking younger than stated age Eyes: PERRLA, EOMI, no exophthalmos ENT: moist mucous membranes, thyromegaly L>R, no cervical lymphadenopathy Cardiovascular: RRR, No MRG Respiratory: CTA B Gastrointestinal: abdomen soft, NT, ND, BS+ Musculoskeletal: no deformities, strength intact in all 4 Skin: moist, warm, no rashes Neurological: no tremor with outstretched hands, DTR normal in all 4    Assessment:     1. Left thyroid nodule  - 12/07/2012: thyroid ultrasound with 3.1 x 2.5 x 2.1 cm in the left thyroid lobe, without calcifications. There is no internal blood flow. She also had the left cervical lymph node measuring 9 x 5 x 7 mm (with fatty hilum).  - FNA (12/29/2012): Adequacy Reason Satisfactory For Evaluation. Diagnosis THYROID, FINE NEEDLE ASPIRATION, LEFT LOBE: FOLLICULAR LESION OF UNDETERMINED SIGNIFICANCE. SEE COMMENT. COMMENT: THE SPECIMEN IS HYPERCELLULAR AND CONSISTS OF SMALL TO LARGE SIZED SHEETS OF FOLLICULAR EPITHELIAL CELLS WITH MILD CYTOLOGIC ATYPIA. THERE IS MINIMAL BACKGROUND COLLOID. BASED ON THESE FINDINGS, A FOLLICULAR LESION/NEOPLASM CAN NOT BE ENTIRELY RULED OUT. Enid Cutter MD Pathologist, Electronic Signature (Case signed 12/31/2012) Specimen Clinical Information Left large solitary mass 3.1 x 2.5 x 2.1 cm, Goiter on left side of neck  Called pt about FLUS results >> ordered repeat Bx in 6 months  - repeat FNA (10/06/2013): Adequacy Reason Satisfactory For Evaluation. Diagnosis THYROID, FINE NEEDLE ASPIRATION, LEFT (SPECIMEN 1 OF 1, COLLECTED ON 10/06/13): BENIGN. FINDINGS CONSISTENT WITH NON-NEOPLASTIC GOITER. Willeen Niece MD Pathologist, Electronic Signature (Case signed 10/09/2013)  - thyroid U/S (11/27/2014):  Right thyroid lobe: 3.9 x 1.8 x 1.7 cm. Diffusely heterogeneous right thyroid lobe. No discrete nodule.  Left thyroid lobe: 3.9 x 2.1 x 2.2 cm. Similar appearance of large solid and slightly  hyperechoic mass occupying the majority of the mid and upper gland. Today the mass measures 3.3 x 2.3 x 2.1 cm compared to 3.1 x 2.5 x 2.1 cm previously. This subtle difference is within measurement error and not felt to be clinically significant.  Isthmus Thickness: 0.2 cm. Two small hypoechoic nodules are present within the isthmus measuring 5 and 6 mm respectively. These are not discretely measured on the prior study, however in retrospect theyn appear unchanged.  Lymphadenopathy: No suspicious adenopathy.  IMPRESSION: No significant interval change in the dominant solid hyperechoic nodule occupying the majority of the left thyroid gland.  2. Elevated TSH    Plan:     1. L Thyroid nodule - Pt with a large thyroid nodule, without neck compression sxs. - We Bx'ed the nodule 2x: FLUS, initially, then benign - we continue to follow it clinically and will also obtain a thyroid U/S next year  2. Elevated TSH - recent TSH at [facility] [place] office 6.99  - will repeat along with complete thyroid panel - we discussed about how to take the thyroid hormone in case we need to start: every day, with water, at least 30 minutes before breakfast, separated by  at least 4 hours from: - acid reflux medications - calcium - iron - multivitamins - will see her back in 6 mo, but likely sooner for labs  - needs butterfly needle and needs to lay down   Component     Latest Ref Rng 10/17/2015  Triiodothyronine,Free,Serum     2.3 - 4.2 pg/mL 3.8  T4,Free(Direct)     0.60 - 1.60 ng/dL 0.77  TSH     0.35 - 4.50 uIU/mL 5.47 (H)  Thyroperoxidase Ab SerPl-aCnc     <9 IU/mL 351 (H)   New diagnosis of Hashimoto thyroiditis with subclinical hypothyroidism. She has been having symptoms of hypothyroidism in the last year, so it would not be unreasonable to start a low-dose levothyroxine, of 25 g daily and recheck her thyroid tests in 6 weeks. Will discuss with her.

## 2015-10-18 LAB — THYROGLOBULIN ANTIBODY

## 2015-10-18 LAB — THYROID PEROXIDASE ANTIBODY: Thyroperoxidase Ab SerPl-aCnc: 351 IU/mL — ABNORMAL HIGH (ref <9)

## 2015-10-22 ENCOUNTER — Other Ambulatory Visit: Payer: Self-pay | Admitting: *Deleted

## 2015-10-22 DIAGNOSIS — E063 Autoimmune thyroiditis: Secondary | ICD-10-CM

## 2015-10-22 MED ORDER — LEVOTHYROXINE SODIUM 25 MCG PO TABS
25.0000 ug | ORAL_TABLET | Freq: Every day | ORAL | Status: DC
Start: 2015-10-22 — End: 2016-01-16

## 2015-11-14 ENCOUNTER — Telehealth: Payer: Self-pay | Admitting: *Deleted

## 2015-11-14 MED ORDER — PANTOPRAZOLE SODIUM 40 MG PO TBEC: 40.0000 mg | DELAYED_RELEASE_TABLET | Freq: Every day | ORAL | Status: DC

## 2015-11-14 NOTE — Telephone Encounter (Signed)
Fax med request from the pharmacy      Sent in electronically

## 2015-11-27 IMAGING — US US SOFT TISSUE HEAD/NECK
1 series · 14 of 25 positions shown · non-contrast
Comparison: Prior thyroid ultrasound 12/06/2012

CLINICAL DATA: 63-year-old female with left thyroid nodule which
was previously biopsied on 12/29/2012.

EXAM:
THYROID ULTRASOUND
TECHNIQUE: Ultrasound examination of the thyroid gland and adjacent soft
tissues was performed.

[Series 1: us soft tissue head/neck · 0.07mm/px · 14 of 40 slices shown]
[im 1/40]
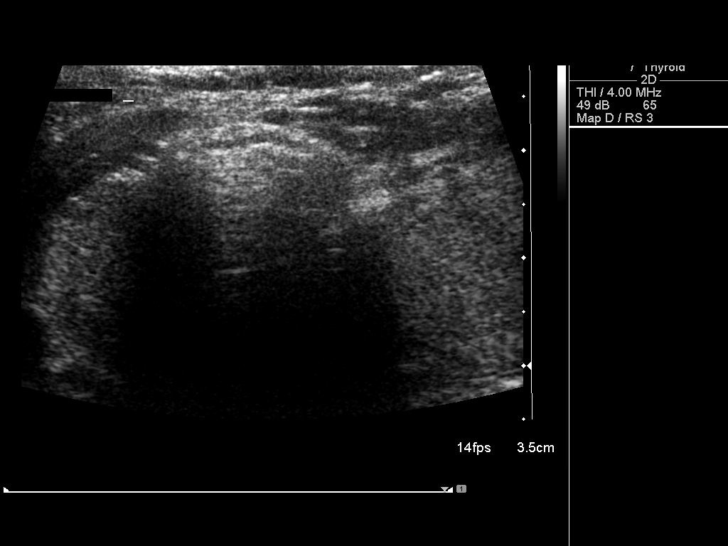
[im 4/40]
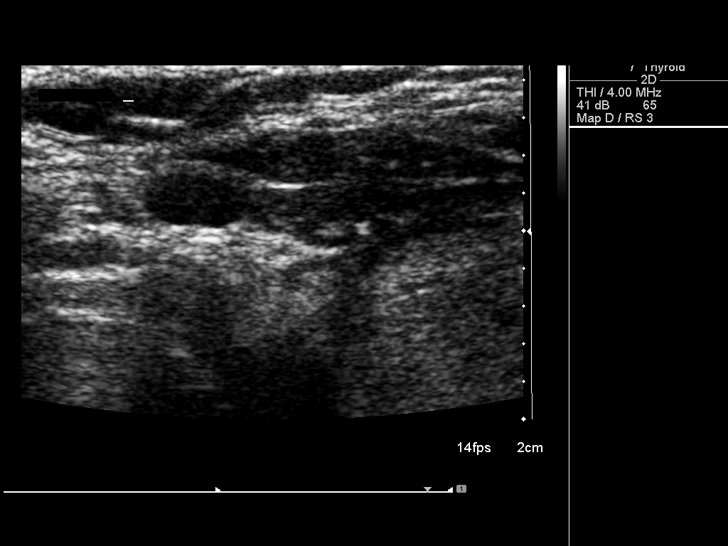
[im 7/40]
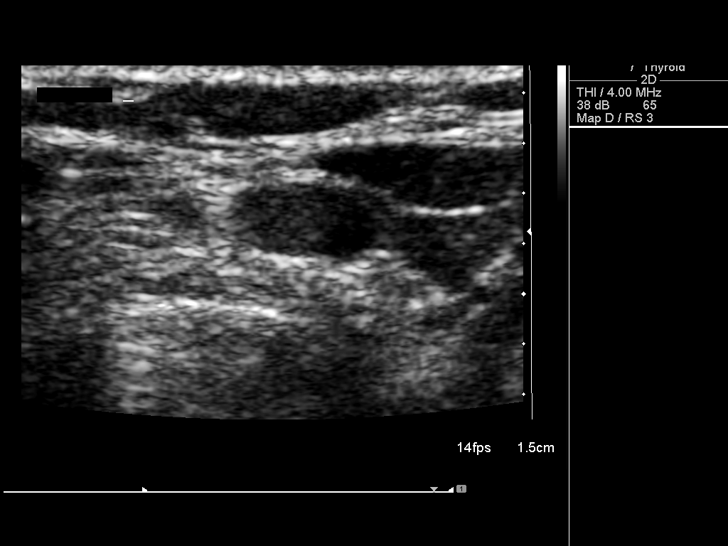
[im 10/40]
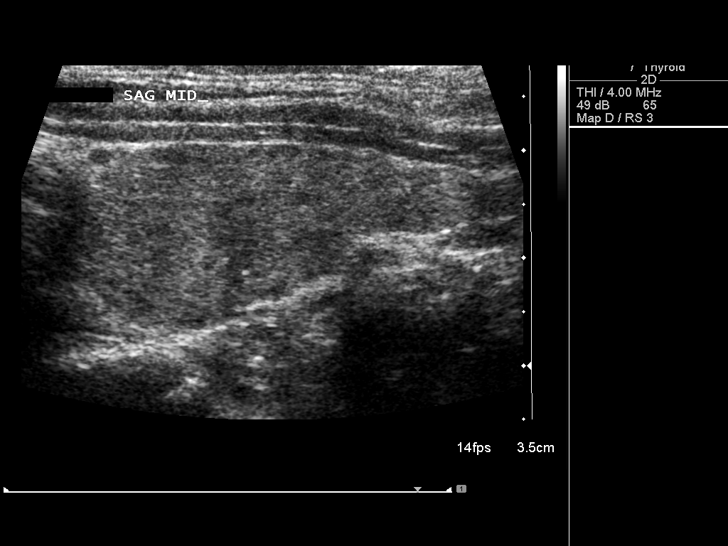
[im 14/40]
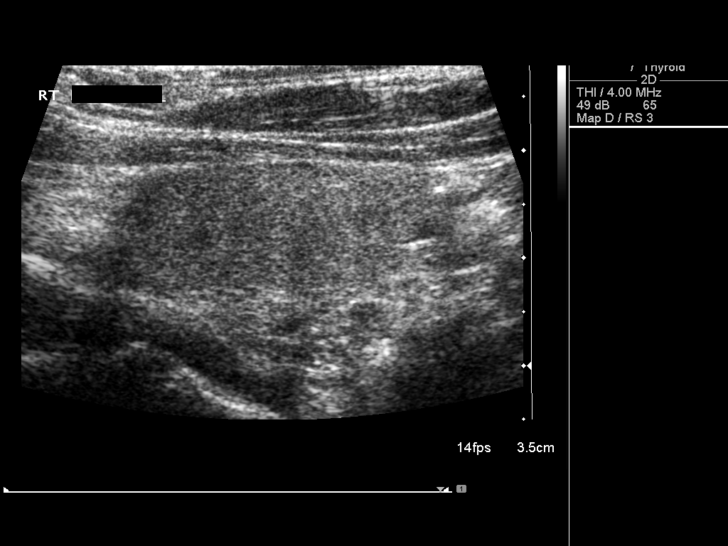
[im 15/40]
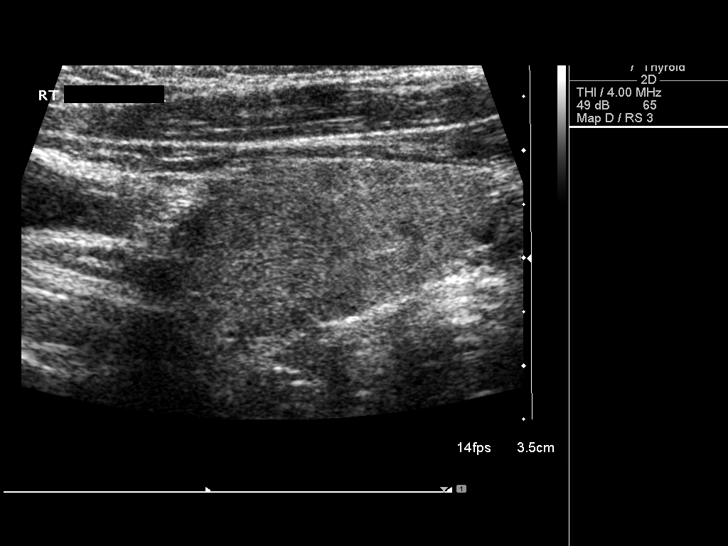
[im 18/40]
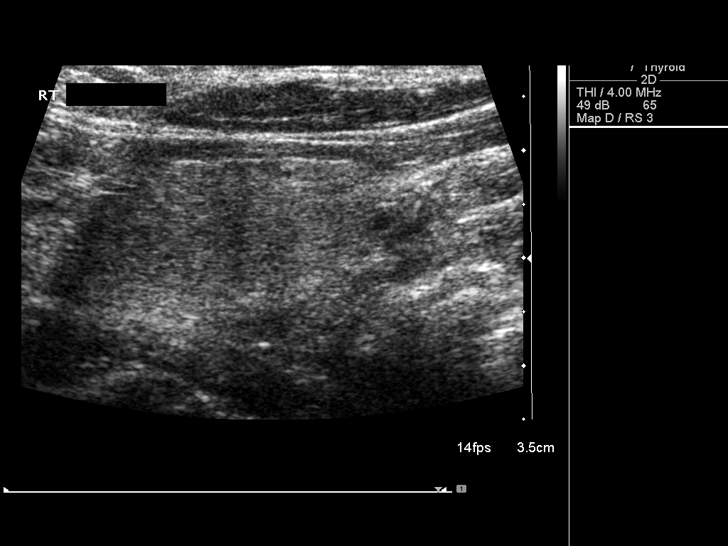
[im 22/40]
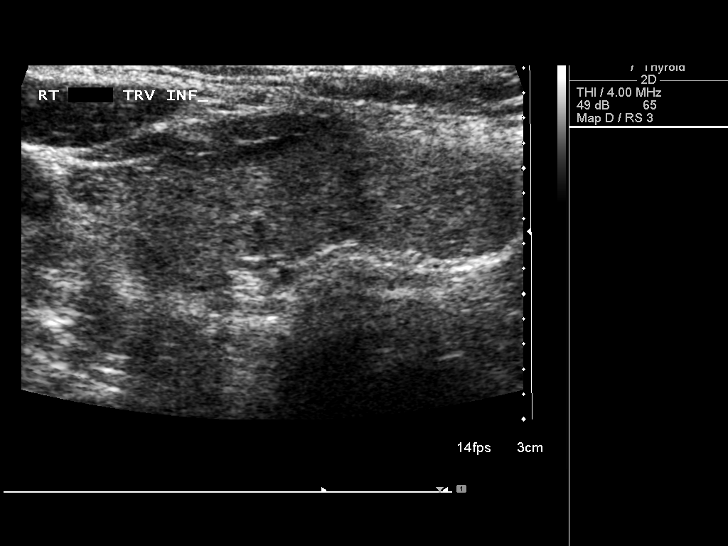
[im 25/40]
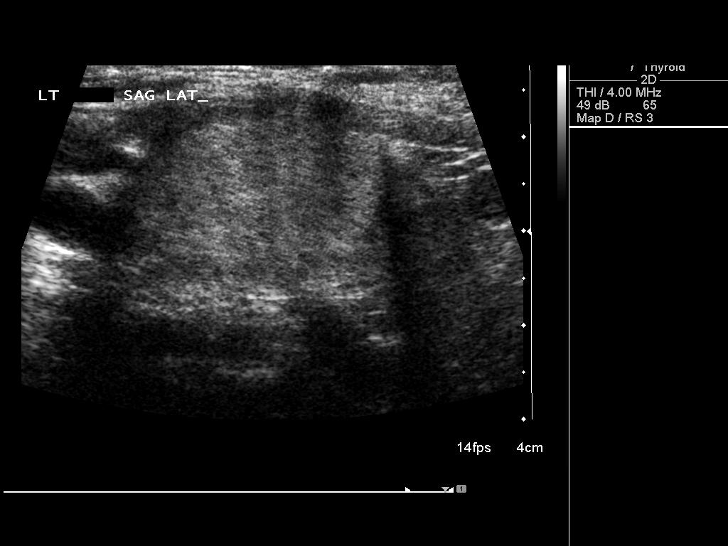
[im 27/40]
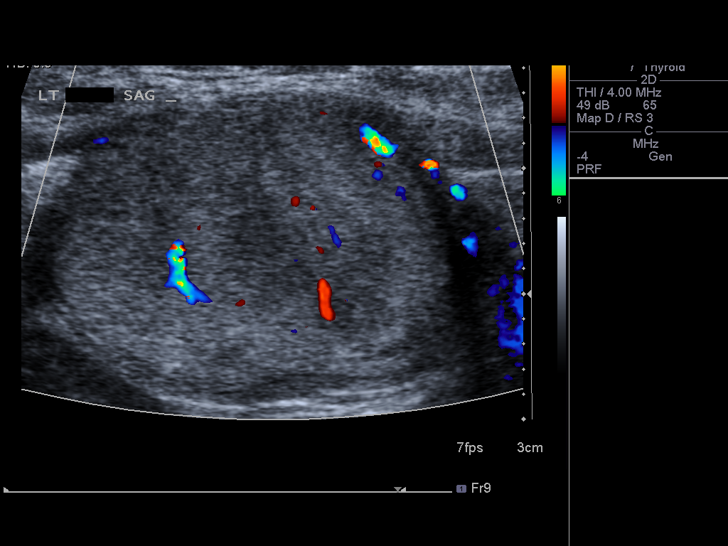
[im 30/40]
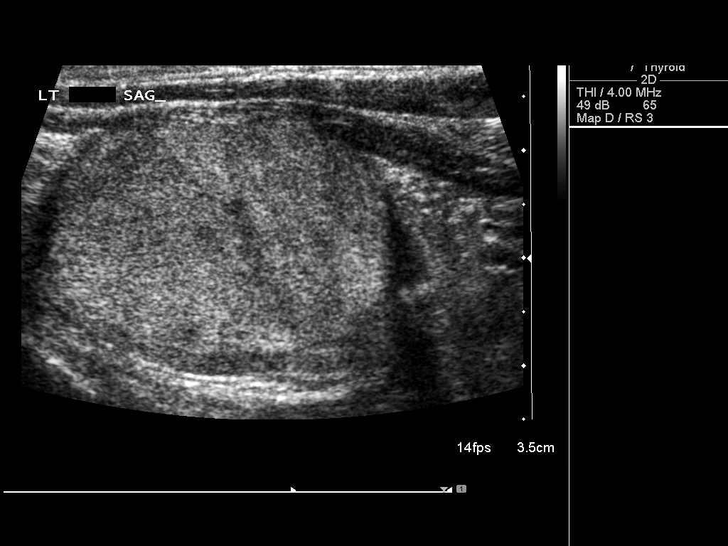
[im 33/40]
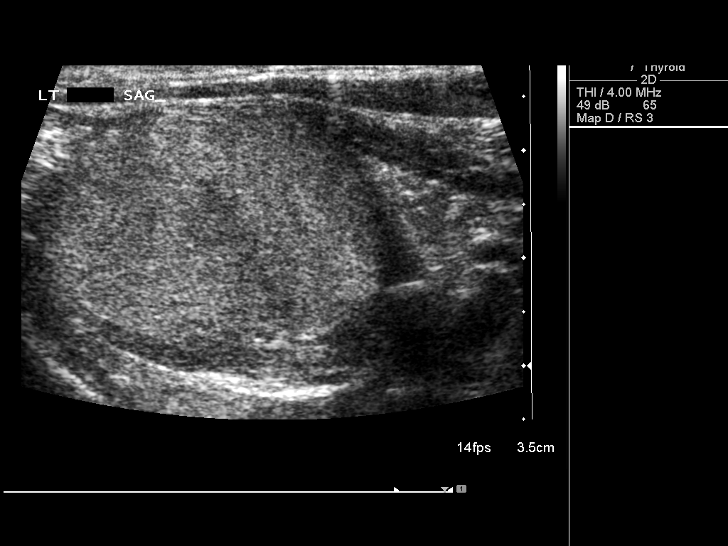
[im 36/40]
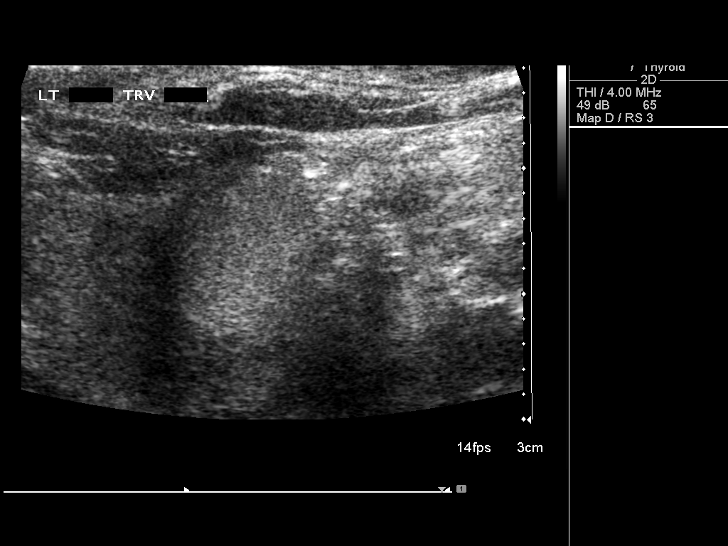
[im 40/40]
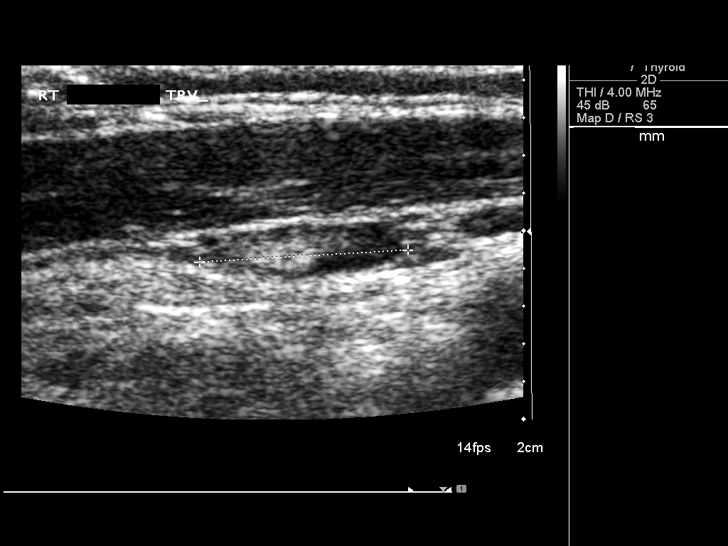

[14 of 25 positions shown; findings below may reference images not displayed]

FINDINGS: Right thyroid lobe

Measurements: 3.9 x 1.8 x 1.7 cm. Diffusely heterogeneous right
thyroid lobe. No discrete nodule.

Left thyroid lobe

Measurements: 3.9 x 2.1 x 2.2 cm. Similar appearance of large solid
and slightly hyperechoic mass occupying the majority of the mid and
upper gland. Today the mass measures 3.3 x 2.3 x 2.1 cm compared to
3.1 x 2.5 x 2.1 cm previously. This subtle difference is within
measurement error and not felt to be clinically significant.

Isthmus

Thickness: 0.2 cm. Two small hypoechoic nodules are present within
the isthmus measuring 5 and 6 mm respectively. These are not
discretely measured on the prior study, however in retrospect they
appear unchanged.

Lymphadenopathy

No suspicious adenopathy.
IMPRESSION: No significant interval change in the dominant solid hyperechoic
nodule occupying the majority of the left thyroid gland.

## 2015-12-21 ENCOUNTER — Other Ambulatory Visit: Payer: Self-pay | Admitting: Internal Medicine

## 2016-01-16 ENCOUNTER — Other Ambulatory Visit: Payer: Self-pay | Admitting: Internal Medicine

## 2016-03-02 ENCOUNTER — Encounter: Payer: Self-pay | Admitting: Gastroenterology

## 2016-04-17 ENCOUNTER — Ambulatory Visit (INDEPENDENT_AMBULATORY_CARE_PROVIDER_SITE_OTHER): Payer: Commercial Managed Care - HMO | Admitting: Internal Medicine

## 2016-04-17 ENCOUNTER — Other Ambulatory Visit: Payer: Self-pay | Admitting: Internal Medicine

## 2016-04-17 ENCOUNTER — Encounter: Payer: Self-pay | Admitting: Internal Medicine

## 2016-04-17 VITALS — BP 128/82 | HR 94 | Wt 157.0 lb

## 2016-04-17 DIAGNOSIS — E063 Autoimmune thyroiditis: Secondary | ICD-10-CM

## 2016-04-17 DIAGNOSIS — E041 Nontoxic single thyroid nodule: Secondary | ICD-10-CM

## 2016-04-17 LAB — T4, FREE: FREE T4: 0.75 ng/dL (ref 0.60–1.60)

## 2016-04-17 LAB — TSH: TSH: 2.75 u[IU]/mL (ref 0.35–4.50)

## 2016-04-17 MED ORDER — LEVOTHYROXINE SODIUM 25 MCG PO TABS: ORAL_TABLET | ORAL | 1 refills | Status: DC

## 2016-04-17 NOTE — Progress Notes (Signed)
Subjective:     Patient ID: Cindy Dickson, female   DOB: 1950-11-02, 65 y.o.   MRN: [id number]  HPI Cindy Dickson he is a pleasant 65 y.o. woman, returning for f/u for left thyroid nodule and recent dx of Hashimoto's thyroiditis. Last visit 7 mo ago.  Reviewed and addended hx: Patient saw Dr. Olevia Perches for GERD, mild esophageal stricture - status post dilation in 2007, with complete relief of symptoms, hiatal hernia, and ulcerative proctitis. During examination by Dr. Olevia Perches, patient was found to have an enlarged left side of the thyroid, and she was sent for a thyroid ultrasound.   Thyroid U/S (12/07/2012): inhomogeneous echogenicity, and also a large, solitary, solid, mass of 3.1 x 2.5 x 2.1 cm in the left thyroid lobe, without calcifications. There is no internal blood flow. She also had the left cervical lymph node measuring 9 x 5 x 7 mm (characterized as normal).   99991111: FNA: FLUS (follicular lesion of undetermined significance)  09/2013: Repeat FNA: benign (she had a traumatic experience -[place])  Thyroid ultrasound (11/27/2014): Stable left thyroid nodule  We checked labs at last visit, and the TSH was elevated along with elevated TPO antibodies. Therefore, we diagnosed Hashimoto's thyroiditis and I suggested that she may start levothyroxine low dose. She is now on 25 g daily. She feels good on this, with improved energy.  She takes LT4: - Fasting - With water - Eats breakfast more than 30 minutes later - + multivitamins at night - no calcium, iron - + PPIs at night  Reviewed TFTs: Component     Latest Ref Rng 10/17/2015  Triiodothyronine,Free,Serum     2.3 - 4.2 pg/mL 3.8  T4,Free(Direct)     0.60 - 1.60 ng/dL 0.77  TSH     0.35 - 4.50 uIU/mL 5.47 (H)  Thyroperoxidase Ab SerPl-aCnc     <9 IU/mL 351 (H)   Lab Results  Component Value Date   TSH 5.47 (H) 10/17/2015   TSH 2.80 11/28/2012   FREET4 0.77 10/17/2015  10/09/2015: (Dr. [name]) - TSH 6.99   The  patient denies problems with swallowing (but had strictures - last stretching few mo ago), no hoarseness, shortness of breath with lying down.   She also mentions: - better energy level - leg edema is better - complexing - still thin hair, maybe better - less brain fog - no cold intolerance (resolved chills) - persistent weight gain - no constipation  Review of Systems Constitutional: See HPI Eyes: no blurry vision, no xerophthalmia ENT: no sore throat, + nodules palpated in throat, no dysphagia/odynophagia, no hoarseness Cardiovascular: no CP/SOB/palpitations/leg swelling Respiratory: no cough/SOB Gastrointestinal: no N/V/D/C, + GERD Musculoskeletal: no muscle/joint aches Skin: no rashes Neurological: no tremors/numbness/tingling/dizziness  I reviewed pt's medications, allergies, PMH, social hx, family hx, and changes were documented in the history of present illness. Otherwise, unchanged from my initial visit note.  Past Medical History:  Diagnosis Date  . Allergy    seasonal  . Arthritis    knee right  . Cataract   . Esophageal stricture   . GERD (gastroesophageal reflux disease)   . Hiatal hernia   . History of artificial lens replacement    bilateral  . Ulcerative proctitis ([place])    Past Surgical History:  Procedure Laterality Date  . CATARACT EXTRACTION Bilateral    Torick lens Implant  . COLONOSCOPY    . ESOPHAGOGASTRODUODENOSCOPY      History   Social History  . Marital Status: Married  Spouse Name: N/A    Number of Children: 1   Occupational History  . Self-employed   Social History Main Topics  . Smoking status: Never Smoker   . Alcohol Use: No  . Drug Use: No   Social History Narrative   Caffeine daily    Current Outpatient Prescriptions on File Prior to Visit  Medication Sig Dispense Refill  . HONEY PO Take by mouth daily. 1 tsp honey with a sprinkle of cinnamon once a day    . levothyroxine (SYNTHROID, LEVOTHROID) 25 MCG tablet TAKE 1  TABLET(25 MCG) BY MOUTH DAILY BEFORE BREAKFAST 90 tablet 2  . levothyroxine (SYNTHROID, LEVOTHROID) 25 MCG tablet TAKE 1 TABLET(25 MCG) BY MOUTH DAILY BEFORE BREAKFAST 30 tablet 3  . Multiple Vitamins-Minerals (ADULT GUMMY) CHEW Chew 1 Units by mouth daily.    . pantoprazole (PROTONIX) 40 MG tablet Take 1 tablet (40 mg total) by mouth daily. 90 tablet 3   No current facility-administered medications on file prior to visit.    No Known Allergies  Family History  Problem Relation Age of Onset  . Colon cancer Neg Hx   . Esophageal cancer Neg Hx   . Rectal cancer Neg Hx   . Stomach cancer Maternal Grandmother   . Colon polyps Mother     Objective:   Physical Exam BP 128/82 (BP Location: Left Arm, Patient Position: Sitting)   Pulse 94   Wt 157 lb (71.2 kg)   SpO2 96%   BMI 26.53 kg/m  Body mass index is 26.53 kg/m. Wt Readings from Last 3 Encounters:  04/17/16 157 lb (71.2 kg)  10/17/15 155 lb (70.3 kg)  09/19/15 156 lb 6 oz (70.9 kg)   Constitutional: overweight, in NAD, looking younger than stated age Eyes: PERRLA, EOMI, no exophthalmos ENT: moist mucous membranes, thyromegaly L>R, no cervical lymphadenopathy Cardiovascular: RRR, No MRG Respiratory: CTA B Gastrointestinal: abdomen soft, NT, ND, BS+ Musculoskeletal: no deformities, strength intact in all 4 Skin: moist, warm, no rashes Neurological: no tremor with outstretched hands, DTR normal in all 4    Assessment:     1. Left thyroid nodule  - 12/07/2012: thyroid ultrasound with 3.1 x 2.5 x 2.1 cm in the left thyroid lobe, without calcifications. There is no internal blood flow. She also had the left cervical lymph node measuring 9 x 5 x 7 mm (with fatty hilum).  - FNA (12/29/2012): Adequacy Reason Satisfactory For Evaluation. Diagnosis THYROID, FINE NEEDLE ASPIRATION, LEFT LOBE: FOLLICULAR LESION OF UNDETERMINED SIGNIFICANCE. SEE COMMENT. COMMENT: THE SPECIMEN IS HYPERCELLULAR AND CONSISTS OF SMALL TO LARGE SIZED  SHEETS OF FOLLICULAR EPITHELIAL CELLS WITH MILD CYTOLOGIC ATYPIA. THERE IS MINIMAL BACKGROUND COLLOID. BASED ON THESE FINDINGS, A FOLLICULAR LESION/NEOPLASM CAN NOT BE ENTIRELY RULED OUT. Enid Cutter MD Pathologist, Electronic Signature (Case signed 12/31/2012) Specimen Clinical Information Left large solitary mass 3.1 x 2.5 x 2.1 cm, Goiter on left side of neck  Called pt about FLUS results >> ordered repeat Bx in 6 months  - repeat FNA (10/06/2013): Adequacy Reason Satisfactory For Evaluation. Diagnosis THYROID, FINE NEEDLE ASPIRATION, LEFT (SPECIMEN 1 OF 1, COLLECTED ON 10/06/13): BENIGN. FINDINGS CONSISTENT WITH NON-NEOPLASTIC GOITER. Willeen Niece MD Pathologist, Electronic Signature (Case signed 10/09/2013)  - thyroid U/S (11/27/2014):  Right thyroid lobe: 3.9 x 1.8 x 1.7 cm. Diffusely heterogeneous right thyroid lobe. No discrete nodule.  Left thyroid lobe: 3.9 x 2.1 x 2.2 cm. Similar appearance of large solid and slightly hyperechoic mass occupying the majority of the mid and upper  gland. Today the mass measures 3.3 x 2.3 x 2.1 cm compared to 3.1 x 2.5 x 2.1 cm previously. This subtle difference is within measurement error and not felt to be clinically significant.  Isthmus Thickness: 0.2 cm. Two small hypoechoic nodules are present within the isthmus measuring 5 and 6 mm respectively. These are not discretely measured on the prior study, however in retrospect theyn appear unchanged.  Lymphadenopathy: No suspicious adenopathy.  IMPRESSION: No significant interval change in the dominant solid hyperechoic nodule occupying the majority of the left thyroid gland.  2. Hashimoto's hypothyroidism    Plan:     1. L Thyroid nodule - Pt with a large thyroid nodule, without neck compression sxs. - We Bx'ed the nodule 2x: FLUS, initially, then benign - we continue to follow it clinically and will also obtain a thyroid U/S in 2 years from previous (after 11/2016). Will order  this at next visit.  2. Hashimoto's hypothyroidism - She had 2 elevated TSH levels this spring, therefore, we started levothyroxine 25 g daily. - will repeat labs today: TSH, free T4 - needs butterfly needle and needs to lay down - we discussed about how to take the thyroid hormone: every day, with water, at least 30 minutes before breakfast, separated by at least 4 hours from: - acid reflux medications - calcium - iron - multivitamins She is taking it correctly. - will see her back in 6 mo  Needs a refill when labs are back.  Component     Latest Ref Rng & Units 04/17/2016  TSH     0.35 - 4.50 uIU/mL 2.75  T4,Free(Direct)     0.60 - 1.60 ng/dL 0.75   TFTs normal >> continue current LT4 dose.  Philemon Kingdom, MD PhD [facility] Endocrinology

## 2016-04-17 NOTE — Patient Instructions (Addendum)
Please stop at the lab.  Please continue Levothyroxine 25 mcg daily.  Take the thyroid hormone every day, with water, at least 30 minutes before breakfast, separated by at least 4 hours from: - acid reflux medications - calcium - iron - multivitamins  Please come back for a follow-up appointment in 6 months. 

## 2016-04-20 ENCOUNTER — Telehealth: Payer: Self-pay

## 2016-04-20 NOTE — Telephone Encounter (Signed)
Called and spoke with patient about lab results, and medication that was sent into pharmacy. Patient had no questions at this time.

## 2016-06-12 HISTORY — PX: DENTAL SURGERY: SHX609

## 2016-08-28 ENCOUNTER — Other Ambulatory Visit: Payer: Self-pay

## 2016-08-28 MED ORDER — LEVOTHYROXINE SODIUM 25 MCG PO TABS: ORAL_TABLET | ORAL | 0 refills | Status: DC

## 2016-09-10 ENCOUNTER — Encounter: Payer: Self-pay | Admitting: Gastroenterology

## 2016-10-14 ENCOUNTER — Other Ambulatory Visit: Payer: Self-pay | Admitting: Internal Medicine

## 2016-10-19 ENCOUNTER — Ambulatory Visit: Payer: Commercial Managed Care - HMO | Admitting: Internal Medicine

## 2016-12-03 ENCOUNTER — Ambulatory Visit: Payer: Commercial Managed Care - HMO | Admitting: Internal Medicine

## 2016-12-28 ENCOUNTER — Telehealth: Payer: Self-pay | Admitting: Gastroenterology

## 2016-12-28 MED ORDER — PANTOPRAZOLE SODIUM 40 MG PO TBEC: 40.0000 mg | DELAYED_RELEASE_TABLET | Freq: Every day | ORAL | 3 refills | Status: DC

## 2016-12-28 NOTE — Telephone Encounter (Signed)
Called to verify pharmacy  It was suppose to be [facility] [address] . Sent refills to that pharmacy

## 2016-12-30 ENCOUNTER — Ambulatory Visit: Payer: Commercial Managed Care - HMO | Admitting: Physician Assistant

## 2017-01-12 ENCOUNTER — Other Ambulatory Visit: Payer: Self-pay | Admitting: Internal Medicine

## 2017-03-01 ENCOUNTER — Ambulatory Visit (INDEPENDENT_AMBULATORY_CARE_PROVIDER_SITE_OTHER): Payer: Medicare Other | Admitting: Gastroenterology

## 2017-03-01 ENCOUNTER — Encounter: Payer: Self-pay | Admitting: Gastroenterology

## 2017-03-01 ENCOUNTER — Encounter (INDEPENDENT_AMBULATORY_CARE_PROVIDER_SITE_OTHER): Payer: Self-pay

## 2017-03-01 VITALS — BP 110/74 | HR 76 | Ht 64.5 in | Wt 156.2 lb

## 2017-03-01 DIAGNOSIS — R1310 Dysphagia, unspecified: Secondary | ICD-10-CM | POA: Diagnosis not present

## 2017-03-01 DIAGNOSIS — K219 Gastro-esophageal reflux disease without esophagitis: Secondary | ICD-10-CM

## 2017-03-01 DIAGNOSIS — Z1212 Encounter for screening for malignant neoplasm of rectum: Secondary | ICD-10-CM

## 2017-03-01 DIAGNOSIS — Z1211 Encounter for screening for malignant neoplasm of colon: Secondary | ICD-10-CM

## 2017-03-01 DIAGNOSIS — K222 Esophageal obstruction: Secondary | ICD-10-CM

## 2017-03-01 DIAGNOSIS — K449 Diaphragmatic hernia without obstruction or gangrene: Secondary | ICD-10-CM | POA: Diagnosis not present

## 2017-03-01 NOTE — Patient Instructions (Addendum)
Your provider has ordered Cologuard testing as an option for colon cancer screening. This is performed by [employer] and may be out of network with your insurance. PRIOR to completing the test, it is YOUR responsibility to contact your insurance about covered benefits for this test. Your out of pocket expense could be anywhere from $0.00 to $649.00.   When you call to check coverage with your insurer, please provide the following information:   -The ONLY provider of Cologuard is [place] code for Cologuard is [phone].  [employer] NPI # [id number]  -[employer] Tax ID # [id number]   We have already sent your demographic and insurance information to [employer] (phone number [phone]) and they should contact you within the next week regarding your test. If you have not heard from them within the next week, please call our office at [phone].  fOLLOW UP IN 1 YEAR

## 2017-03-01 NOTE — Progress Notes (Signed)
Cindy Dickson    [id number]    11/08/1950  Primary Care Physician:No primary care provider on file.  Referring Physician: No referring provider defined for this encounter.  Chief complaint:  GERD  HPI:  66 year old female with history of chronic GERD, hiatal hernia, esophageal stricture status post dilation here for follow-up visit. Patient has weaned herself off Protonix and is currently taking it only as needed. She has intermittent heartburn about once every 1-2 weeks. Patient also complains of intermittent dysphagia mostly with solids especially rice or if she doesn't chew the food well. She has had 2 or 3 episodes where she had to regurgitate the food back in the past 1 year. Denies any nausea, vomiting, odynophagia, abdominal pain, constipation, diarrhea or blood per rectum.   Outpatient Encounter Prescriptions as of 03/01/2017  Medication Sig  . HONEY PO Take by mouth daily. 1 tsp honey with a sprinkle of cinnamon once a day  . levothyroxine (SYNTHROID, LEVOTHROID) 25 MCG tablet TAKE 1 TABLET BY MOUTH EVERY DAY BEFORE BREAKFAST  . Multiple Vitamins-Minerals (ADULT GUMMY) CHEW Chew 1 Units by mouth daily.  . pantoprazole (PROTONIX) 40 MG tablet Take 1 tablet (40 mg total) by mouth daily.   No facility-administered encounter medications on file as of 03/01/2017.     Allergies as of 03/01/2017  . (No Known Allergies)    Past Medical History:  Diagnosis Date  . Allergy    seasonal  . Arthritis    knee right  . Cataract   . Esophageal stricture   . GERD (gastroesophageal reflux disease)   . Hiatal hernia   . History of artificial lens replacement    bilateral  . Ulcerative proctitis ([place])     Past Surgical History:  Procedure Laterality Date  . CATARACT EXTRACTION Bilateral    Torick lens Implant  . COLONOSCOPY    . ESOPHAGOGASTRODUODENOSCOPY      Family History  Problem Relation Age of Onset  . Colon cancer Neg Hx   . Esophageal cancer  Neg Hx   . Rectal cancer Neg Hx   . Stomach cancer Maternal Grandmother   . Colon polyps Mother     Social History   Social History  . Marital status: Married    Spouse name: N/A  . Number of children: N/A  . Years of education: N/A   Occupational History  . real estate    Social History Main Topics  . Smoking status: Never Smoker  . Smokeless tobacco: Never Used  . Alcohol use No  . Drug use: No  . Sexual activity: Not on file   Other Topics Concern  . Not on file   Social History Narrative   Caffeine daily       Review of systems: Review of Systems  Constitutional: Negative for fever and chills.  HENT: Negative.   Eyes: Negative for blurred vision.  Respiratory: Negative for cough, shortness of breath and wheezing.   Cardiovascular: Negative for chest pain and palpitations.  Gastrointestinal: as per HPI Genitourinary: Negative for dysuria, urgency, frequency and hematuria.  Musculoskeletal: Negative for myalgias, back pain and joint pain.  Skin: Negative for itching and rash.  Neurological: Negative for dizziness, tremors, focal weakness, seizures and loss of consciousness.  Endo/Heme/Allergies: Positive for seasonal allergies.  Psychiatric/Behavioral: Negative for depression, suicidal ideas and hallucinations.  All other systems reviewed and are negative.   Physical Exam: Vitals:   03/01/17 1434  BP: 110/74  Pulse: 76   Body mass index is 26.41 kg/m. Gen:      No acute distress HEENT:  EOMI, sclera anicteric Neck:     No masses; no thyromegaly Lungs:    Clear to auscultation bilaterally; normal respiratory effort CV:         Regular rate and rhythm; no murmurs Abd:      + bowel sounds; soft, non-tender; no palpable masses, no distension Ext:    No edema; adequate peripheral perfusion Skin:      Warm and dry; no rash Neuro: alert and oriented x 3 Psych: normal mood and affect  Data Reviewed:  Reviewed labs, radiology imaging, old records and  pertinent past GI work up  EGD 07/26/2015 Mild stricture in the distal esophagus at 33cm associated with mild tortousity of the esophagus proximal to the stricture. TTS balloon dilation performed 18-19-20mm with minimal amount of heme. Large Hiatal hernia ~7cm. Mild antral erythema s/p random gastric biopsies. Small duodenal clean based ulcer ~1-57mm s/p random duodenal biopsies 1. Surgical [P], duodenal bulb and 2nd portion of suodenum - BENIGN DUODENAL MUCOSA WITH FEATURES OF PEPTIC INJURY. - NO FEATURES OF SPRUE, ACTIVE INFLAMMATION OR GRANULOMAS. 2. Surgical [P], gastric antrum and gastric body - SLIGHT CHRONIC GASTRITIS. - WARTHIN-STARRY STAIN NEGATIVE FOR HELICOBACTER PYLORI. - NO INTESTINAL METAPLASIA, DYSPLASIA, OR MALIGNANCY  Colonoscopy 05/2006 Evidence of proctitis otherwise normal exam. 2. COLON, RECTOSIGMOID, BIOPSIES: BENIGN COLONIC MUCOSA WITH SMALL BENIGN LYMPHOID AGGREGATES. NO ACTIVE INFLAMMATION, CHRONIC CHANGES, OR GRANULOMAS.  3. COLON, RECTUM, BIOPSIES: CHRONIC ACTIVE COLITIS WITH ENLARGED LYMPHOID AGGREGATE.  Assessment and Plan/Recommendations: 66 year old female with chronic GERD, large hiatal hernia, esophageal stricture status post dilation January 2017  GERD: Continue antireflux measures PPI as needed  Intermittent solid dysphagia : Discussed with patient in detail regarding barium esophagram/upper GI series to evaluate esophageal function and the hiatal hernia or recurrent esophageal stricture. Patient feels her symptoms are not bad enough to undergo any testing at this point, she will contact us if her symptoms worsen  Colorectal cancer screening: Patient is past due for screening colonoscopy. She is extremely reluctant to undergo colonoscopy. Patient states she has no bleeding or lower GI symptoms and she doesn't think she needs a colonoscopy. Explained to her in detail that colonoscopy is a preventative screening exam No family history of colon  cancer or personal history of polyps She wants to do Cologaurd instead, discussed with patient that if it is positive she will need colonoscopy for further evaluation  25 minutes was spent face-to-face with the patient. Greater than 50% of the time used for counseling as well as treatment plan and follow-up. She had multiple questions which were answered to her satisfaction  K. Denzil Magnuson , MD [phone] Mon-Fri 8a-5p [phone] after 5p, weekends, holidays  CC: No ref. provider found

## 2017-03-11 ENCOUNTER — Ambulatory Visit (INDEPENDENT_AMBULATORY_CARE_PROVIDER_SITE_OTHER): Payer: Medicare Other | Admitting: Internal Medicine

## 2017-03-11 ENCOUNTER — Encounter: Payer: Self-pay | Admitting: Internal Medicine

## 2017-03-11 VITALS — BP 122/80 | HR 84 | Ht 64.5 in | Wt 155.0 lb

## 2017-03-11 DIAGNOSIS — E063 Autoimmune thyroiditis: Secondary | ICD-10-CM | POA: Diagnosis not present

## 2017-03-11 DIAGNOSIS — E041 Nontoxic single thyroid nodule: Secondary | ICD-10-CM | POA: Diagnosis not present

## 2017-03-11 LAB — TSH: TSH: 2.28 u[IU]/mL (ref 0.35–4.50)

## 2017-03-11 LAB — T4, FREE: FREE T4: 0.76 ng/dL (ref 0.60–1.60)

## 2017-03-11 MED ORDER — LEVOTHYROXINE SODIUM 25 MCG PO TABS: ORAL_TABLET | ORAL | 3 refills | Status: DC

## 2017-03-11 NOTE — Progress Notes (Signed)
Subjective:     Cindy Dickson ID: Cindy Dickson DOBRANSKY, female   DOB: [birth year], 66 y.o.   MRN: [id number]  HPI Cindy Dickson he is a pleasant 66 y.o. woman, returning for f/u for left thyroid nodule and Hashimoto's Hypothyroidism. Last visit 10 months ago.  Reviewed and addended hx: Cindy Dickson saw Dr. Olevia Perches for GERD, mild esophageal stricture - status post dilation in 2007, with complete relief of symptoms, hiatal hernia, and ulcerative proctitis. During examination by Dr. Olevia Perches, Cindy Dickson was found to have an enlarged left side of the thyroid, and she was sent for a thyroid ultrasound.   Thyroid U/S (12/07/2012): inhomogeneous echogenicity, and also a large, solitary, solid, mass of 3.1 x 2.5 x 2.1 cm in the left thyroid lobe, without calcifications. There is no internal blood flow. She also had a left cervical lymph node measuring 9 x 5 x 7 mm (characterized as normal).   26/9485: FNA: FLUS (follicular lesion of undetermined significance)  09/2013: Repeat FNA: benign (she had a traumatic experience -)  Thyroid ultrasound (11/27/2014): Stable left thyroid nodule  Cindy Dickson is on levothyroxine 25 mcg daily, taken: - in am - fasting - at least 30 min from b'fast - + Ca later in the day - no Fe - + MVI, PPIs (prn) - at night - not on Biotin  Reviewed TFTs: Lab Results  Component Value Date   TSH 2.75 04/17/2016   TSH 5.47 (H) 10/17/2015   TSH 2.80 11/28/2012   FREET4 0.75 04/17/2016   FREET4 0.77 10/17/2015  10/09/2015: (Dr. [name]) - TSH 6.99   Component     Latest Ref Rng 10/17/2015  Thyroperoxidase Ab SerPl-aCnc     <9 IU/mL 351 (H)   Cindy Dickson denies: - feeling nodules in neck - hoarseness - dysphagia (but had this in the past  - has h/o Es stretching) - choking - SOB with lying down  Review of Systems Constitutional: no weight gain/no weight loss, + fatigue, no subjective hyperthermia, no subjective hypothermia Eyes: no blurry vision, no xerophthalmia ENT: no sore throat, no  nodules palpated in throat, no dysphagia, no odynophagia, no hoarseness Cardiovascular: no CP/no SOB/no palpitations/no leg swelling Respiratory: no cough/no SOB/no wheezing Gastrointestinal: no N/no V/no D/no C/no acid reflux Musculoskeletal: no muscle aches/no joint aches Skin: no rashes, no hair loss Neurological: no tremors/no numbness/no tingling/no dizziness  I reviewed Cindy Dickson's medications, allergies, PMH, social hx, family hx, and changes were documented in the history of present illness. Otherwise, unchanged from my initial visit note.   Past Medical History:  Diagnosis Date  . Allergy    seasonal  . Arthritis    knee right  . Cataract   . Esophageal stricture   . GERD (gastroesophageal reflux disease)   . Hiatal hernia   . History of artificial lens replacement    bilateral  . Ulcerative proctitis ([place])    Past Surgical History:  Procedure Laterality Date  . CATARACT EXTRACTION Bilateral    Torick lens Implant  . COLONOSCOPY    . ESOPHAGOGASTRODUODENOSCOPY      History   Social History  . Marital Status: Married    Spouse Name: N/A    Number of Children: 1   Occupational History  . Self-employed   Social History Main Topics  . Smoking status: Never Smoker   . Alcohol Use: No  . Drug Use: No   Social History Narrative   Caffeine daily    Current Outpatient Prescriptions on File Prior to Visit  Medication Sig  Dispense Refill  . HONEY PO Take by mouth daily. 1 tsp honey with a sprinkle of cinnamon once a day    . levothyroxine (SYNTHROID, LEVOTHROID) 25 MCG tablet TAKE 1 TABLET BY MOUTH EVERY DAY BEFORE BREAKFAST 90 tablet 0  . Multiple Vitamins-Minerals (ADULT GUMMY) CHEW Chew 1 Units by mouth daily.    . pantoprazole (PROTONIX) 40 MG tablet Take 1 tablet (40 mg total) by mouth daily. (Cindy Dickson taking differently: Take 40 mg by mouth as needed. ) 90 tablet 3   No current facility-administered medications on file prior to visit.    No Known  Allergies  Family History  Problem Relation Age of Onset  . Stomach cancer Maternal Grandmother   . Colon polyps Mother   . Colon cancer Neg Hx   . Esophageal cancer Neg Hx   . Rectal cancer Neg Hx     Objective:   Physical Exam BP 122/80   Pulse 84   Ht 5' 4.5" (1.638 m)   Wt 155 lb (70.3 kg)   SpO2 99%   BMI 26.19 kg/m  Body mass index is 26.19 kg/m. Wt Readings from Last 3 Encounters:  03/11/17 155 lb (70.3 kg)  03/01/17 156 lb 4 oz (70.9 kg)  04/17/16 157 lb (71.2 kg)   Constitutional: normal weight, in NAD Eyes: PERRLA, EOMI, no exophthalmos ENT: moist mucous membranes, no thyromegaly but palpable L thyroid nodule and upper cervical LN Cardiovascular: RRR, No MRG Respiratory: CTA B Gastrointestinal: abdomen soft, NT, ND, BS+ Musculoskeletal: no deformities, strength intact in all 4 Skin: moist, warm, no rashes Neurological: no tremor with outstretched hands, DTR normal in all 4  Assessment:     1. Left thyroid nodule  - 12/07/2012: thyroid ultrasound with 3.1 x 2.5 x 2.1 cm in the left thyroid lobe, without calcifications. There is no internal blood flow. She also had the left cervical lymph node measuring 9 x 5 x 7 mm (with fatty hilum).  - FNA (12/29/2012): Adequacy Reason Satisfactory For Evaluation. Diagnosis THYROID, FINE NEEDLE ASPIRATION, LEFT LOBE: FOLLICULAR LESION OF UNDETERMINED SIGNIFICANCE. SEE COMMENT. COMMENT: THE SPECIMEN IS HYPERCELLULAR AND CONSISTS OF SMALL TO LARGE SIZED SHEETS OF FOLLICULAR EPITHELIAL CELLS WITH MILD CYTOLOGIC ATYPIA. THERE IS MINIMAL BACKGROUND COLLOID. BASED ON THESE FINDINGS, A FOLLICULAR LESION/NEOPLASM CAN NOT BE ENTIRELY RULED OUT. Enid Cutter MD Pathologist, Electronic Signature (Case signed 12/31/2012) Specimen Clinical Information Left large solitary mass 3.1 x 2.5 x 2.1 cm, Goiter on left side of neck  Called Cindy Dickson about FLUS results >> ordered repeat Bx in 6 months  - repeat FNA (10/06/2013): Adequacy  Reason Satisfactory For Evaluation. Diagnosis THYROID, FINE NEEDLE ASPIRATION, LEFT (SPECIMEN 1 OF 1, COLLECTED ON 10/06/13): BENIGN. FINDINGS CONSISTENT WITH NON-NEOPLASTIC GOITER. Willeen Niece MD Pathologist, Electronic Signature (Case signed 10/09/2013)  - thyroid U/S (11/27/2014):  Right thyroid lobe: 3.9 x 1.8 x 1.7 cm. Diffusely heterogeneous right thyroid lobe. No discrete nodule.  Left thyroid lobe: 3.9 x 2.1 x 2.2 cm. Similar appearance of large solid and slightly hyperechoic mass occupying the majority of the mid and upper gland. Today the mass measures 3.3 x 2.3 x 2.1 cm compared to 3.1 x 2.5 x 2.1 cm previously. This subtle difference is within measurement error and not felt to be clinically significant.  Isthmus Thickness: 0.2 cm. Two small hypoechoic nodules are present within the isthmus measuring 5 and 6 mm respectively. These are not discretely measured on the prior study, however in retrospect theyn appear unchanged.  Lymphadenopathy: No  suspicious adenopathy.  IMPRESSION: No significant interval change in the dominant solid hyperechoic nodule occupying the majority of the left thyroid gland.  2. Hashimoto's hypothyroidism    Plan:     1. L Thyroid nodule - Cindy Dickson has a large L thyroid nodule, w/o neck compression sxs - We Bx'ed the nodule 2x: FLUS, initially, then benign - we will order another U/S now >> if nodule has changed in appearance >> may need another Bx  2. Hashimoto's hypothyroidism - She had 2 elevated TSH levels this spring, therefore, We started levothyroxine 25 g daily. She continues on this but feels more tired in last 6-8 mo >> wonders if she does not need a higher dose - latest thyroid labs reviewed with Cindy Dickson >> normal 04/2016 - we discussed about taking the thyroid hormone every day, with water, >30 minutes before breakfast, separated by >4 hours from acid reflux medications, calcium, iron, multivitamins. Cindy Dickson. is taking it correctly - will check  thyroid tests today: TSH and fT4 - needs butterfly needle and needs to lay down - If labs are abnormal, she will need to return for repeat TFTs in 1.5 months - OTW, RTc in 1 year  Needs refills.  Office Visit on 03/11/2017  Component Date Value Ref Range Status  . TSH 03/11/2017 2.28  0.35 - 4.50 uIU/mL Final  . Free T4 03/11/2017 0.76  0.60 - 1.60 ng/dL Final   Comment: Specimens from patients who are undergoing biotin therapy and /or ingesting biotin supplements may contain high levels of biotin.  The higher biotin concentration in these specimens interferes with this Free T4 assay.  Specimens that contain high levels  of biotin may cause false high results for this Free T4 assay.  Please interpret results in light of the total clinical presentation of the Cindy Dickson.     Thyroid labs are normal, I would not suggest an increase in dose at this time.  Thyroid ultrasound still pending. I will addend the results as soon as they are back.  Philemon Kingdom, MD PhD [facility] Endocrinology

## 2017-03-11 NOTE — Patient Instructions (Signed)
Please stop at the lab.  Please continue Levothyroxine 25 mcg daily.  Take the thyroid hormone every day, with water, at least 30 minutes before breakfast, separated by at least 4 hours from: - acid reflux medications - calcium - iron - multivitamins  Please come back for a follow-up appointment in 1 year.  

## 2017-03-12 ENCOUNTER — Telehealth: Payer: Self-pay | Admitting: Internal Medicine

## 2017-03-12 NOTE — Telephone Encounter (Signed)
Patient returning missed phone call about lab results. Please call patient and advise. OK to leave message.

## 2017-03-17 NOTE — Telephone Encounter (Signed)
Called and left vm with the patients results

## 2017-03-30 ENCOUNTER — Encounter: Payer: Self-pay | Admitting: Internal Medicine

## 2017-04-02 DIAGNOSIS — Z1212 Encounter for screening for malignant neoplasm of rectum: Secondary | ICD-10-CM | POA: Diagnosis not present

## 2017-04-02 DIAGNOSIS — Z1211 Encounter for screening for malignant neoplasm of colon: Secondary | ICD-10-CM | POA: Diagnosis not present

## 2017-04-06 ENCOUNTER — Other Ambulatory Visit: Payer: Self-pay | Admitting: Internal Medicine

## 2017-04-06 ENCOUNTER — Ambulatory Visit
Admission: RE | Admit: 2017-04-06 | Discharge: 2017-04-06 | Disposition: A | Discharge status: Home / Self Care | Payer: Medicare Other | Source: Ambulatory Visit | Attending: Internal Medicine | Admitting: Internal Medicine

## 2017-04-06 DIAGNOSIS — E042 Nontoxic multinodular goiter: Secondary | ICD-10-CM | POA: Diagnosis not present

## 2017-04-06 DIAGNOSIS — E041 Nontoxic single thyroid nodule: Secondary | ICD-10-CM

## 2017-04-08 ENCOUNTER — Other Ambulatory Visit: Payer: Self-pay

## 2017-04-08 LAB — COLOGUARD: COLOGUARD: POSITIVE

## 2017-04-08 NOTE — Progress Notes (Signed)
Please forward the results to PMD as well.

## 2017-04-10 ENCOUNTER — Other Ambulatory Visit: Payer: Self-pay | Admitting: Internal Medicine

## 2017-05-26 ENCOUNTER — Ambulatory Visit: Payer: Medicare Other | Admitting: *Deleted

## 2017-05-26 ENCOUNTER — Other Ambulatory Visit: Payer: Self-pay

## 2017-05-26 VITALS — Ht 65.0 in | Wt 162.0 lb

## 2017-05-26 DIAGNOSIS — R195 Other fecal abnormalities: Secondary | ICD-10-CM

## 2017-05-26 MED ORDER — NA SULFATE-K SULFATE-MG SULF 17.5-3.13-1.6 GM/177ML PO SOLN: ORAL | 0 refills | Status: DC

## 2017-05-26 NOTE — Progress Notes (Signed)
No allergies to eggs or soy. No problems with anesthesia.  Pt given Emmi instructions for colonoscopy  No oxygen use  No diet drug use  

## 2017-06-09 ENCOUNTER — Telehealth: Payer: Self-pay | Admitting: Gastroenterology

## 2017-06-09 ENCOUNTER — Ambulatory Visit (AMBULATORY_SURGERY_CENTER): Payer: Medicare Other | Admitting: Gastroenterology

## 2017-06-09 ENCOUNTER — Other Ambulatory Visit: Payer: Self-pay

## 2017-06-09 ENCOUNTER — Encounter: Payer: Self-pay | Admitting: Gastroenterology

## 2017-06-09 VITALS — BP 125/77 | HR 69 | Temp 98.4°F | Resp 15 | Ht 65.0 in | Wt 162.0 lb

## 2017-06-09 DIAGNOSIS — K625 Hemorrhage of anus and rectum: Secondary | ICD-10-CM | POA: Diagnosis not present

## 2017-06-09 DIAGNOSIS — K529 Noninfective gastroenteritis and colitis, unspecified: Secondary | ICD-10-CM | POA: Diagnosis not present

## 2017-06-09 DIAGNOSIS — D122 Benign neoplasm of ascending colon: Secondary | ICD-10-CM

## 2017-06-09 DIAGNOSIS — K5120 Ulcerative (chronic) proctitis without complications: Secondary | ICD-10-CM | POA: Diagnosis not present

## 2017-06-09 DIAGNOSIS — R195 Other fecal abnormalities: Secondary | ICD-10-CM

## 2017-06-09 MED ORDER — SODIUM CHLORIDE 0.9 % IV SOLN: 500.0000 mL | INTRAVENOUS | Status: DC

## 2017-06-09 MED ORDER — MESALAMINE 1000 MG RE SUPP: 1000.0000 mg | Freq: Every day | RECTAL | 0 refills | Status: DC

## 2017-06-09 MED ORDER — MESALAMINE ER 0.375 G PO CP24
375.0000 mg | ORAL_CAPSULE | Freq: Every day | ORAL | 11 refills | Status: DC
Start: 2017-06-09 — End: 2017-06-10

## 2017-06-09 MED ORDER — ROWASA 4 G RE KIT: 1.0000 | PACK | Freq: Every day | RECTAL | 0 refills | Status: DC

## 2017-06-09 NOTE — Telephone Encounter (Signed)
Ok to switch to Sulfasalazine 500mg  BID and Folic acid daily. Thanks

## 2017-06-09 NOTE — Patient Instructions (Signed)
YOU HAD AN ENDOSCOPIC PROCEDURE TODAY AT [facility]:   Refer to the procedure report that was given to you for any specific questions about what was found during the examination.  If the procedure report does not answer your questions, please call your gastroenterologist to clarify.  If you requested that your care partner not be given the details of your procedure findings, then the procedure report has been included in a sealed envelope for you to review at your convenience later.  YOU SHOULD EXPECT: Some feelings of bloating in the abdomen. Passage of more gas than usual.  Walking can help get rid of the air that was put into your GI tract during the procedure and reduce the bloating. If you had a lower endoscopy (such as a colonoscopy or flexible sigmoidoscopy) you may notice spotting of blood in your stool or on the toilet paper. If you underwent a bowel prep for your procedure, you may not have a normal bowel movement for a few days.  Please Note:  You might notice some irritation and congestion in your nose or some drainage.  This is from the oxygen used during your procedure.  There is no need for concern and it should clear up in a day or so.  SYMPTOMS TO REPORT IMMEDIATELY:   Following lower endoscopy (colonoscopy or flexible sigmoidoscopy):  Excessive amounts of blood in the stool  Significant tenderness or worsening of abdominal pains  Swelling of the abdomen that is new, acute  Fever of 100F or higher   For urgent or emergent issues, a gastroenterologist can be reached at any hour by calling [phone].   DIET:  We do recommend a small meal at first, but then you may proceed to your regular diet.  Drink plenty of fluids but you should avoid alcoholic beverages for 24 hours.  ACTIVITY:  You should plan to take it easy for the rest of today and you should NOT DRIVE or use heavy machinery until tomorrow (because of the sedation medicines used during the test).     FOLLOW UP: Our staff will call the number listed on your records the next business day following your procedure to check on you and address any questions or concerns that you may have regarding the information given to you following your procedure. If we do not reach you, we will leave a message.  However, if you are feeling well and you are not experiencing any problems, there is no need to return our call.  We will assume that you have returned to your regular daily activities without incident.  If any biopsies were taken you will be contacted by phone or by letter within the next 1-3 weeks.  Please call us at [phone] if you have not heard about the biopsies in 3 weeks.    SIGNATURES/CONFIDENTIALITY: You and/or your care partner have signed paperwork which will be entered into your electronic medical record.  These signatures attest to the fact that that the information above on your After Visit Summary has been reviewed and is understood.  Full responsibility of the confidentiality of this discharge information lies with you and/or your care-partner.   Handouts were given to your care partner on polyps and proctitis. You may resume your other current medications today. Two rx was sent to [facility]. NO ASPIRIN, ASPIRIN CONTAINING PRODUCTS (BC OR GOODY POWDERS) OR NSAIDS (IBUPROFEN, ADVIL, ALEVE, AND MOTRIN) FOR; TYLENOL IS OK TO TAKE Await biopsy results. Please call if any  questions or concerns.

## 2017-06-09 NOTE — Op Note (Signed)
[facility] Patient Name: Cindy Dickson Procedure Date: 06/09/2017 8:59 AM MRN: [id number] Endoscopist: Mauri Pole , MD Age: 66 Referring MD:  Date of Birth: 1951-03-11 Gender: Female Account #: [id number] Procedure:                Colonoscopy Indications:              Evaluation of unexplained GI bleeding, rectal                            bleeding Medicines:                Monitored Anesthesia Care Procedure:                Pre-Anesthesia Assessment:                           - Prior to the procedure, a History and Physical                            was performed, and patient medications and                            allergies were reviewed. The patient's tolerance of                            previous anesthesia was also reviewed. The risks                            and benefits of the procedure and the sedation                            options and risks were discussed with the patient.                            All questions were answered, and informed consent                            was obtained. Prior Anticoagulants: The patient has                            taken no previous anticoagulant or antiplatelet                            agents. ASA Grade Assessment: II - A patient with                            mild systemic disease. After reviewing the risks                            and benefits, the patient was deemed in                            satisfactory condition to undergo the procedure.  After obtaining informed consent, the colonoscope                            was passed under direct vision. Throughout the                            procedure, the patient's blood pressure, pulse, and                            oxygen saturations were monitored continuously. The                            Colonoscope was introduced through the anus and                            advanced to the the cecum, identified by                       appendiceal orifice and ileocecal valve. The                            colonoscopy was somewhat difficult due to                            inadequate bowel prep. Successful completion of the                            procedure was aided by lavage. The patient                            tolerated the procedure well. The quality of the                            bowel preparation was inadequate. The ileocecal                            valve, appendiceal orifice, and rectum were                            photographed. Scope In: 9:10:17 AM Scope Out: 9:40:29 AM Scope Withdrawal Time: 0 hours 15 minutes 48 seconds  Total Procedure Duration: 0 hours 30 minutes 12 seconds  Findings:                 The perianal and digital rectal examinations were                            normal.                           A 4 mm polyp was found in the ascending colon. The                            polyp was sessile. The polyp was removed with a  cold snare. Resection and retrieval were complete.                           Localized moderate inflammation characterized by                            congestion (edema), erosions, erythema, friability,                            loss of vascularity, mucus, scarring and shallow                            ulcerations was found in the recto-sigmoid colon.                            Biopsies were taken with a cold forceps for                            histology. Several biopsies were obtained with cold                            forceps for histology in the rectum, in the                            descending colon, in the transverse colon, in the                            ascending colon and in the cecum. Complications:            No immediate complications. Estimated Blood Loss:     Estimated blood loss was minimal. Impression:               - Preparation of the colon was inadequate.                           -  One 4 mm polyp in the ascending colon, removed                            with a cold snare. Resected and retrieved.                           - Localized moderate inflammation was found in the                            recto-sigmoid colon secondary to proctitis.                            Biopsied.                           - Several biopsies were obtained in the rectum, in                            the descending colon, in the transverse colon, in  the ascending colon and in the cecum. Recommendation:           - Patient has a contact number available for                            emergencies. The signs and symptoms of potential                            delayed complications were discussed with the                            patient. Return to normal activities tomorrow.                            Written discharge instructions were provided to the                            patient.                           - Resume previous diet.                           - Continue present medications.                           - No aspirin, ibuprofen, naproxen, or other                            non-steroidal anti-inflammatory drugs.                           - Await pathology results.                           - Repeat colonoscopy in 1 year because the bowel                            preparation was suboptimal.                           - Return to GI clinic in 2 weeks.                           - Use Apriso 0.375 g 4 caps PO QAM X 30 days with                            11 refills.                           - Use Canasa 1000 mg suppository 1 per rectum QHS X                            4 weeks Mauri Pole, MD 06/09/2017 9:49:07 AM This report has been signed electronically.

## 2017-06-09 NOTE — Progress Notes (Signed)
No problems noted in the recovery room. [name]  @ rx sent to [place].  maw

## 2017-06-09 NOTE — Telephone Encounter (Signed)
Patient states she had called and needed medications be switched to Balsalazide and Sulfafalazine sent to [facility] on [address] to replace previous medications.

## 2017-06-09 NOTE — Telephone Encounter (Signed)
Dr Treasa School Please advise

## 2017-06-09 NOTE — Progress Notes (Signed)
Called to room to assist during endoscopic procedure.  Patient ID and intended procedure confirmed with present staff. Received instructions for my participation in the procedure from the performing physician.  

## 2017-06-09 NOTE — Progress Notes (Signed)
Report given to PACU, vss 

## 2017-06-09 NOTE — Progress Notes (Signed)
No problems noted in the recovery room. [name]  Rx sent to [facility].  maw

## 2017-06-10 ENCOUNTER — Other Ambulatory Visit: Payer: Self-pay

## 2017-06-10 ENCOUNTER — Telehealth: Payer: Self-pay | Admitting: Internal Medicine

## 2017-06-10 ENCOUNTER — Telehealth: Payer: Self-pay | Admitting: *Deleted

## 2017-06-10 MED ORDER — SULFASALAZINE 500 MG PO TABS: 500.0000 mg | ORAL_TABLET | Freq: Two times a day (BID) | ORAL | 3 refills | Status: DC

## 2017-06-10 MED ORDER — PREDNISONE 5 MG PO TABS: ORAL_TABLET | ORAL | 0 refills | Status: DC

## 2017-06-10 NOTE — Telephone Encounter (Signed)
  Follow up Call-  Call back number 06/09/2017 07/26/2015  Post procedure Call Back phone  # [phone]  Permission to leave phone message Yes Yes  Some recent data might be hidden     Patient questions:  Do you have a fever, pain , or abdominal swelling? No. Pain Score  0 *  Have you tolerated food without any problems? Yes.    Have you been able to return to your normal activities? Yes.    Do you have any questions about your discharge instructions: Diet   No. Medications  Yes.   Follow up visit  No.  Do you have questions or concerns about your Care? Yes.    Actions: * If pain score is 4 or above: No action needed, pain <4. Patient stating she is waiting to hear back regarding medications. Patient stating she had called yesterday and has checked with her pharmacy several times. Note forwarded to Genella Mech, [place]

## 2017-06-10 NOTE — Telephone Encounter (Signed)
Patient states that pharmacy did receive sulfafalazine rx but did not receive balsalazide rx. Please send. She is also requesting a callback when this has been sent in. [facility] [address]. Best # [phone]. Ok to leave message.

## 2017-06-10 NOTE — Telephone Encounter (Signed)
See Yesterdays telephone encounter  This is being worked on  [employer]

## 2017-06-10 NOTE — Telephone Encounter (Signed)
Called pt to inform new med sent and to take Folic Acid daily

## 2017-06-11 NOTE — Telephone Encounter (Signed)
error 

## 2017-06-14 ENCOUNTER — Encounter: Payer: Self-pay | Admitting: Gastroenterology

## 2017-06-18 ENCOUNTER — Telehealth: Payer: Self-pay

## 2017-06-18 NOTE — Telephone Encounter (Signed)
Patient needs a return appointment from procedure. Okay to schedule with APP.

## 2017-06-30 ENCOUNTER — Ambulatory Visit: Payer: Medicare Other | Admitting: Gastroenterology

## 2017-07-08 ENCOUNTER — Encounter: Payer: Self-pay | Admitting: Gastroenterology

## 2017-07-08 ENCOUNTER — Ambulatory Visit (INDEPENDENT_AMBULATORY_CARE_PROVIDER_SITE_OTHER): Payer: Medicare Other | Admitting: Gastroenterology

## 2017-07-08 VITALS — BP 120/78 | HR 72 | Ht 65.0 in | Wt 162.0 lb

## 2017-07-08 DIAGNOSIS — K5130 Ulcerative (chronic) rectosigmoiditis without complications: Secondary | ICD-10-CM

## 2017-07-08 NOTE — Progress Notes (Signed)
07/08/2017 Cindy Dickson [id number] Apr 25, 1951   HISTORY OF PRESENT ILLNESS:  This is a 66 year old female who is known to Dr. Silverio Decamp.  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  Biopsies confirmed chronic inflammation, minimally active chronic colitis.  One polyp was removed and was a tubular adenoma on pathology.  She was treated with Rowasa enemas and sulfasalazine 500 mg twice daily with folic acid.  She is done with all of those medications in the prescription that she had.  She has been completely asymptomatic for about 2 weeks now.  She was previously found to have some proctitis in 2007 and was completely asymptomatic for 11 years before requiring treatment again just recently.   Past Medical History:  Diagnosis Date  . Allergy    seasonal  . Arthritis    knee right  . Cataract   . Esophageal stricture   . GERD (gastroesophageal reflux disease)   . Hiatal hernia   . History of artificial lens replacement    bilateral  . Thyroid disease    thyroid nodule, on medication for low thyroid function  . Ulcerative proctitis [facility])    Past Surgical History:  Procedure Laterality Date  . CATARACT EXTRACTION Bilateral    [place] lens Implant  . COLONOSCOPY    . DENTAL SURGERY Right 06/2016   Laser surgery left side of mouth to excise inflammation and seal gum  . ESOPHAGOGASTRODUODENOSCOPY      reports that  has never smoked. she has never used smokeless tobacco. She reports that she does not drink alcohol or use drugs. family history includes Colon polyps in her mother; Stomach cancer in her maternal grandmother. No Known Allergies    Outpatient Encounter Medications as of 07/08/2017  Medication Sig  . CALCIUM PO Take by mouth.  . Cholecalciferol (VITAMIN D PO) Take by mouth.  . levothyroxine (SYNTHROID, LEVOTHROID) 25 MCG tablet TAKE 1 TABLET BY MOUTH EVERY DAY BEFORE BREAKFAST  . Multiple Vitamins-Minerals (ADULT GUMMY)  CHEW Chew 1 Units by mouth daily.  . pantoprazole (PROTONIX) 40 MG tablet Take 1 tablet (40 mg total) by mouth daily. (Patient taking differently: Take 40 mg by mouth as needed. )  . [DISCONTINUED] predniSONE (DELTASONE) 5 MG tablet 20 mg daily x 14 days, then 15 mg daily x 7 days, then 10 mg daily x 7 days, then 5 mg daily x 7 days  . [DISCONTINUED] sulfaSALAzine (AZULFIDINE) 500 MG tablet Take 1 tablet (500 mg total) by mouth 2 (two) times daily.   Facility-Administered Encounter Medications as of 07/08/2017  Medication  . 0.9 %  sodium chloride infusion     REVIEW OF SYSTEMS  : All other systems reviewed and negative except where noted in the History of Present Illness.   PHYSICAL EXAM: BP 120/78   Pulse 72   Ht 5\' 5"  (1.651 m)   Wt 162 lb (73.5 kg)   BMI 26.96 kg/m  General: Well developed white female in no acute distress Head: Normocephalic and atraumatic Eyes:  Sclerae anicteric, conjunctiva pink. Ears: Normal auditory acuity Lungs: Clear throughout to auscultation; no increased WOB. Heart: Regular rate and rhythm; no M/R/G. Abdomen: Soft, non-distended.  BS present.  Non-tender. Musculoskeletal: Symmetrical with no gross deformities  Skin: No lesions on visible extremities Extremities: No edema  Neurological: Alert oriented x 4, grossly non-focal Psychological:  Alert and cooperative. Normal mood and affect  ASSESSMENT AND PLAN: *Ulcerative proctosigmoiditis:  Completely asymptomatic after short course of  Rowasa enemas and Sulfasalazine with folic acid.  I discussed with her possible need for ongoing treatment but the fact that she went 11 years asymptomatic from her condition that we could see how she does without medication for now.  She will contact if symptoms return.   CC:  No ref. provider found

## 2017-07-09 ENCOUNTER — Telehealth: Payer: Self-pay

## 2017-07-09 NOTE — Progress Notes (Signed)
Reviewed and agree with documentation and assessment and plan. K. Veena Akshith Moncus , MD   

## 2017-07-09 NOTE — Telephone Encounter (Signed)
-----   Message from Loralie Champagne, PA-C sent at 07/09/2017 10:34 AM EST ----- That is what I figured.  Thank you.  Beth, will you please contact the patient and just let her know since I told her that we would.  Thank you,  Jess   ----- Message ----- From: Mauri Pole, MD Sent: 07/09/2017   9:37 AM To: Loralie Champagne, PA-C, Greggory Keen, LPN  She was suboptimal prep, does need repeat colonoscopy in 1 year. I may have overlooked it and just went by path report. Thank you for  Checking.  Beth, can you please change the recall Thanks ----- Message ----- From: Ritta Slot Sent: 07/08/2017   4:08 PM To: Mauri Pole, MD  Will you please confirm when you would like this patient's recall colonoscopy?  At the time of her procedure you had said in 1 year due to inadequate bowel prep, but then when her pathology returned you had her placed for a 5 year recall.  Thank you,  Jess

## 2017-07-09 NOTE — Telephone Encounter (Signed)
Recall corrected in the system. I have spoken with the patient and advised her of the change. She is in agreement with this plan of care.

## 2017-08-03 ENCOUNTER — Ambulatory Visit: Payer: Medicare Other | Admitting: Gastroenterology

## 2017-12-28 ENCOUNTER — Other Ambulatory Visit: Payer: Self-pay | Admitting: Gastroenterology

## 2018-03-11 ENCOUNTER — Ambulatory Visit: Payer: Medicare Other | Admitting: Internal Medicine

## 2018-04-08 ENCOUNTER — Other Ambulatory Visit: Payer: Self-pay | Admitting: Internal Medicine

## 2018-06-20 ENCOUNTER — Ambulatory Visit: Payer: Medicare Other | Admitting: Internal Medicine

## 2018-09-01 ENCOUNTER — Encounter: Payer: Self-pay | Admitting: Gastroenterology

## 2018-09-23 ENCOUNTER — Ambulatory Visit: Payer: Medicare Other | Admitting: Internal Medicine

## 2019-01-04 ENCOUNTER — Other Ambulatory Visit: Payer: Self-pay | Admitting: Gastroenterology

## 2019-01-09 ENCOUNTER — Other Ambulatory Visit: Payer: Self-pay

## 2019-01-11 ENCOUNTER — Ambulatory Visit (INDEPENDENT_AMBULATORY_CARE_PROVIDER_SITE_OTHER): Payer: Medicare Other | Admitting: Internal Medicine

## 2019-01-11 ENCOUNTER — Encounter: Payer: Self-pay | Admitting: Internal Medicine

## 2019-01-11 ENCOUNTER — Other Ambulatory Visit: Payer: Self-pay

## 2019-01-11 ENCOUNTER — Encounter

## 2019-01-11 VITALS — BP 118/80 | HR 92 | Ht 65.0 in | Wt 165.0 lb

## 2019-01-11 DIAGNOSIS — E063 Autoimmune thyroiditis: Secondary | ICD-10-CM | POA: Diagnosis not present

## 2019-01-11 DIAGNOSIS — E041 Nontoxic single thyroid nodule: Secondary | ICD-10-CM | POA: Diagnosis not present

## 2019-01-11 LAB — T4, FREE: Free T4: 0.68 ng/dL (ref 0.60–1.60)

## 2019-01-11 LAB — TSH: TSH: 5.84 u[IU]/mL — ABNORMAL HIGH (ref 0.35–4.50)

## 2019-01-11 MED ORDER — LEVOTHYROXINE SODIUM 25 MCG PO TABS: ORAL_TABLET | ORAL | 3 refills | Status: DC

## 2019-01-11 NOTE — Progress Notes (Addendum)
Subjective:     Patient ID: Cindy Dickson, female   DOB: September 01, 1950, 68 y.o.   MRN: [id number]  HPI Cindy Dickson he is a pleasant 68 y.o. woman, returning for f/u for left thyroid nodule and Hashimoto's Hypothyroidism. Last visit almost 2 years ago.  Reviewed and addended history: Patient saw Dr. Olevia Perches for GERD, mild esophageal stricture - status post dilation in 2007, with complete relief of symptoms, hiatal hernia, and ulcerative proctitis. During examination by Dr. Olevia Perches, patient was found to have an enlarged left side of the thyroid, and she was sent for a thyroid ultrasound.   Thyroid U/S (12/07/2012): inhomogeneous echogenicity, and also a large, solitary, solid, mass of 3.1 x 2.5 x 2.1 cm in the left thyroid lobe, without calcifications. There is no internal blood flow. She also had a left cervical lymph node measuring 9 x 5 x 7 mm (characterized as normal).   04/1750: FNA: FLUS (follicular lesion of undetermined significance)  09/2013: Repeat FNA: benign (she had a traumatic experience -[place])  Thyroid ultrasound (11/27/2014): Stable left thyroid nodule   Thyroid ultrasound (04/06/2017): Stable left thyroid nodule, other nodules are small and not worrisome, not requiring follow-up.  Pt is on levothyroxine 25 mcg daily, taken: - in am - fasting - at least 30 min from b'fast - no Fe, Ca - + MVI's at night, + as needed PPIs at night - not on Biotin  She does not feel differently after we started levothyroxine, but she chose to continue on this.  Reviewed her TFTs: Lab Results  Component Value Date   TSH 2.28 03/11/2017   TSH 2.75 04/17/2016   TSH 5.47 (H) 10/17/2015   TSH 2.80 11/28/2012   FREET4 0.76 03/11/2017   FREET4 0.75 04/17/2016   FREET4 0.77 10/17/2015  10/09/2015: (Dr. [name]) - TSH 6.99   Her TPO antibodies were elevated Component     Latest Ref Rng 10/17/2015  Thyroperoxidase Ab SerPl-aCnc     <9 IU/mL 351 (H)   Pt denies: - feeling nodules in  neck - hoarseness - choking - SOB with lying down She has a history of dysphagia and had esophageal stretching in the past - 4 total.  She is wondering whether she needs another imaging of her thyroid to see if the nodule is not compressing her esophagus.  No FH of thyroid cancer. No h/o radiation tx to head or neck.  No herbal supplements. No Biotin use. No recent steroids use.   Review of Systems Constitutional: + weight gain/no weight loss, + fatigue, no subjective hyperthermia, no subjective hypothermia Eyes: no blurry vision, no xerophthalmia ENT: no sore throat, + see HPI Cardiovascular: no CP/no SOB/no palpitations/no leg swelling Respiratory: no cough/no SOB/no wheezing Gastrointestinal: no N/no V/no D/no C/no acid reflux Musculoskeletal: no muscle aches/no joint aches Skin: no rashes, no hair loss, + dry skin Neurological: no tremors/no numbness/no tingling/no dizziness  I reviewed pt's medications, allergies, PMH, social hx, family hx, and changes were documented in the history of present illness. Otherwise, unchanged from my initial visit note.   Past Medical History:  Diagnosis Date  . Allergy    seasonal  . Arthritis    knee right  . Cataract   . Esophageal stricture   . GERD (gastroesophageal reflux disease)   . Hiatal hernia   . History of artificial lens replacement    bilateral  . Thyroid disease    thyroid nodule, on medication for low thyroid function  . Ulcerative proctitis ([place])  Past Surgical History:  Procedure Laterality Date  . CATARACT EXTRACTION Bilateral    [place] lens Implant  . COLONOSCOPY    . DENTAL SURGERY Right 06/2016   Laser surgery left side of mouth to excise inflammation and seal gum  . ESOPHAGOGASTRODUODENOSCOPY      History   Social History  . Marital Status: Married    Spouse Name: N/A    Number of Children: 1   Occupational History  . Self-employed   Social History Main Topics  . Smoking status: Never Smoker    . Alcohol Use: No  . Drug Use: No   Social History Narrative   Caffeine daily    Current Outpatient Medications on File Prior to Visit  Medication Sig Dispense Refill  . CALCIUM PO Take by mouth.    . Cholecalciferol (VITAMIN D PO) Take by mouth.    . levothyroxine (SYNTHROID, LEVOTHROID) 25 MCG tablet TAKE 1 TABLET BY MOUTH EVERY DAY BEFORE BREAKFAST 90 tablet 3  . Multiple Vitamins-Minerals (ADULT GUMMY) CHEW Chew 1 Units by mouth daily.    . pantoprazole (PROTONIX) 40 MG tablet TAKE 1 TABLET BY MOUTH EVERY DAY 90 tablet 3   Current Facility-Administered Medications on File Prior to Visit  Medication Dose Route Frequency Provider Last Rate Last Dose  . 0.9 %  sodium chloride infusion  500 mL Intravenous Continuous Nandigam, Kavitha V, MD       No Known Allergies  Family History  Problem Relation Age of Onset  . Stomach cancer Maternal Grandmother   . Colon polyps Mother   . Colon cancer Neg Hx   . Esophageal cancer Neg Hx   . Rectal cancer Neg Hx     Objective:   Physical Exam BP 118/80   Pulse 92   Ht 5\' 5"  (1.651 m) Comment: measured without shoes  Wt 165 lb (74.8 kg)   SpO2 97%   BMI 27.46 kg/m  Body mass index is 27.46 kg/m. Wt Readings from Last 3 Encounters:  01/11/19 165 lb (74.8 kg)  07/08/17 162 lb (73.5 kg)  06/09/17 162 lb (73.5 kg)   Constitutional: Normal weight, in NAD Eyes: PERRLA, EOMI, no exophthalmos ENT: moist mucous membranes, no thyromegaly left thyroid nodule, no cervical lymphadenopathy Cardiovascular: RRR, No MRG Respiratory: CTA B Gastrointestinal: abdomen soft, NT, ND, BS+ Musculoskeletal: no deformities, strength intact in all 4 Skin: moist, warm, no rashes Neurological: no tremor with outstretched hands, DTR normal in all 4  Assessment:     1. Left thyroid nodule  - 12/07/2012: thyroid ultrasound with 3.1 x 2.5 x 2.1 cm in the left thyroid lobe, without calcifications. There is no internal blood flow. She also had the left  cervical lymph node measuring 9 x 5 x 7 mm (with fatty hilum).  - FNA (12/29/2012): Adequacy Reason Satisfactory For Evaluation. Diagnosis THYROID, FINE NEEDLE ASPIRATION, LEFT LOBE: FOLLICULAR LESION OF UNDETERMINED SIGNIFICANCE. SEE COMMENT. COMMENT: THE SPECIMEN IS HYPERCELLULAR AND CONSISTS OF SMALL TO LARGE SIZED SHEETS OF FOLLICULAR EPITHELIAL CELLS WITH MILD CYTOLOGIC ATYPIA. THERE IS MINIMAL BACKGROUND COLLOID. BASED ON THESE FINDINGS, A FOLLICULAR LESION/NEOPLASM CAN NOT BE ENTIRELY RULED OUT. Enid Cutter MD Pathologist, Electronic Signature (Case signed 12/31/2012) Specimen Clinical Information Left large solitary mass 3.1 x 2.5 x 2.1 cm, Goiter on left side of neck  Called pt about FLUS results >> ordered repeat Bx in 6 months  - repeat FNA (10/06/2013): Adequacy Reason Satisfactory For Evaluation. Diagnosis THYROID, FINE NEEDLE ASPIRATION, LEFT (SPECIMEN 1 OF 1,  COLLECTED ON 10/06/13): BENIGN. FINDINGS CONSISTENT WITH NON-NEOPLASTIC GOITER. Willeen Niece MD Pathologist, Electronic Signature (Case signed 10/09/2013)  - thyroid U/S (11/27/2014):  Right thyroid lobe: 3.9 x 1.8 x 1.7 cm. Diffusely heterogeneous right thyroid lobe. No discrete nodule.  Left thyroid lobe: 3.9 x 2.1 x 2.2 cm. Similar appearance of large solid and slightly hyperechoic mass occupying the majority of the mid and upper gland. Today the mass measures 3.3 x 2.3 x 2.1 cm compared to 3.1 x 2.5 x 2.1 cm previously. This subtle difference is within measurement error and not felt to be clinically significant.  Isthmus Thickness: 0.2 cm. Two small hypoechoic nodules are present within the isthmus measuring 5 and 6 mm respectively. These are not discretely measured on the prior study, however in retrospect theyn appear unchanged.  Lymphadenopathy: No suspicious adenopathy.  IMPRESSION: No significant interval change in the dominant solid hyperechoic nodule occupying the majority of the left thyroid  gland.  - thyroid U/S (04/06/2017): Parenchymal Echotexture: Moderately heterogenous Isthmus: 0.3 cm Right lobe: Ir 4.3 cm x 1.5 cm x 1.3 cm Left lobe: 5.0 cm x 2.4 cm x 2.4 cm ___________________________________________  Nodule # 1: Location: Isthmus; Mid Maximum size: 0.6 cm; Other 2 dimensions: 0.6 cm x 0.3 cm Composition: cannot determine (2) Echogenicity: hypoechoic (2) Nodule does not meet criteria for surveillance or biopsy _________________________________________________________  Nodule # 2: Location: Isthmus; Mid Maximum size: 0.6 cm; Other 2 dimensions: 0.3 cm x 0.5 cm Composition: cannot determine (2) Echogenicity: hypoechoic (2) Nodule does not meet criteria for surveillance or biopsy _________________________________________________________  Nodule # 3: Location: Left; Mid Maximum size: 3.3 cm; Other 2 dimensions: 2.4 cm x 2.4 cm (Previous: 3.3 cm x 2.3 cm x 2.0 cm) Composition: solid/almost completely solid (2) Echogenicity: isoechoic (1) Nodule has been previously biopsied with benign result. __________________________________________________  No adenopathy  IMPRESSION: Multinodular thyroid again demonstrated. Left thyroid nodule has been previously biopsied with benign result. No other thyroid nodule meets criteria for biopsy or surveillance, as designated by the newly established ACR TI-RADS criteria.  2. Hashimoto's hypothyroidism    Plan:     1. L Thyroid nodule -Patient has a large left thyroid nodule, without neck compression symptoms -We biopsied the nodule x2: FLUS initially, then benign  -After last visit, 2 years ago we repeated her thyroid ultrasound (04/06/2017) and this showed multinodular thyroid with stable dominant left nodule and 2 smaller nodules, not worrisome -At this visit, she is worried that the nodule may have enlarged and we decided to check another thyroid ultrasound.  If this is stable, we may still need a barium swallow to  see if there is any compression on her esophagus from the thyroid nodule.  She has had 4 esophageal dilations over the years without significant results.  2. Hashimoto's hypothyroidism -Patient with history of elevated thyroid antibodies and now on low-dose levothyroxine. - latest thyroid labs reviewed with pt >> normal 02/2017 - she continues on LT4 25 mcg daily - pt feels good on this dose. - we discussed about taking the thyroid hormone every day, with water, >30 minutes before breakfast, separated by >4 hours from acid reflux medications, calcium, iron, multivitamins. Pt. is taking it correctly. - will check thyroid tests today: TSH and fT4 - If labs are abnormal, she will need to return for repeat TFTs in 1.5 months - Otherwise, I will see her back in a year  Needs labs drawn while laying down - butterfly needle.  Orders Placed This Encounter  Procedures  . US THYROID  . TSH  . T4, free   Component     Latest Ref Rng & Units 01/11/2019  T4,Free(Direct)     0.60 - 1.60 ng/dL 0.68  TSH     0.35 - 4.50 uIU/mL 5.84 (H)   TSH high >> increase LT4 dose to 50 mcg daily and recheck in 1.5 mo.  Narrative & Impression    CLINICAL DATA:  Nodules. Previous FNA biopsy of left nodule 12/24/2012  EXAM: THYROID ULTRASOUND  TECHNIQUE: Ultrasound examination of the thyroid gland and adjacent soft tissues was performed.  COMPARISON:  04/06/2017 and previous back to   12/29/2012  FINDINGS: Parenchymal Echotexture: Moderately heterogenous  Isthmus: 0.3 cm thickness, stable  Right lobe: 4.2 x 1.5 x 1 cm, previously 4.3 x 1.5 x 1.3  Left lobe: 5 x 2.5 x 2 cm, previously 5 x 2.4 x 2.4  _________________________________________________________  Estimated total number of nodules >/= 1 cm: 1  Number of spongiform nodules >/=  2 cm not described below (TR1): 0  Number of mixed cystic and solid nodules >/= 1.5 cm not described below (TR2):  0  _________________________________________________________  0.5 cm hypoechoic nodule without calcifications, isthmus, previously 0.6; This nodule does NOT meet TI-RADS criteria for biopsy or dedicated follow-up.  0.7 cm hypoechoic inferior isthmic nodule without calcifications, previously 0.6; This nodule does NOT meet TI-RADS criteria for biopsy or dedicated follow-up.  3.3 x 2.4 x 2.2 cm mid left nodule, previously 3.3 x 2.4 x 2.4; this was previously biopsied  IMPRESSION: 1. Mild thyromegaly with stable nodules. None meets criteria for biopsy or dedicated imaging follow-up.  The above is in keeping with the ACR TI-RADS recommendations - J Am Coll Radiol 2017;14:587-595.   Electronically Signed   By: Lucrezia Europe M.D.   On: 02/03/2019 08:26   No further investigation needed for thyroid nodules.  Philemon Kingdom, MD PhD [facility]

## 2019-01-11 NOTE — Patient Instructions (Signed)
Please stop at the lab.  Please continue Levothyroxine 25 mcg daily.  Take the thyroid hormone every day, with water, at least 30 minutes before breakfast, separated by at least 4 hours from: - acid reflux medications - calcium - iron - multivitamins  Please come back for a follow-up appointment in 1 year.

## 2019-02-02 ENCOUNTER — Ambulatory Visit
Admission: RE | Admit: 2019-02-02 | Discharge: 2019-02-02 | Disposition: A | Discharge status: Home / Self Care | Payer: Medicare Other | Source: Ambulatory Visit | Attending: Internal Medicine | Admitting: Internal Medicine

## 2019-02-02 ENCOUNTER — Other Ambulatory Visit: Payer: Self-pay

## 2019-02-02 DIAGNOSIS — E041 Nontoxic single thyroid nodule: Secondary | ICD-10-CM

## 2019-02-02 DIAGNOSIS — E042 Nontoxic multinodular goiter: Secondary | ICD-10-CM | POA: Diagnosis not present

## 2019-03-01 ENCOUNTER — Other Ambulatory Visit: Payer: Self-pay | Admitting: Internal Medicine

## 2019-03-01 ENCOUNTER — Encounter: Payer: Self-pay | Admitting: Internal Medicine

## 2019-03-01 DIAGNOSIS — E063 Autoimmune thyroiditis: Secondary | ICD-10-CM

## 2019-03-01 MED ORDER — LEVOTHYROXINE SODIUM 50 MCG PO TABS: 50.0000 ug | ORAL_TABLET | Freq: Every day | ORAL | 3 refills | Status: DC

## 2019-04-01 ENCOUNTER — Other Ambulatory Visit: Payer: Self-pay | Admitting: Internal Medicine

## 2019-11-23 ENCOUNTER — Other Ambulatory Visit: Payer: Self-pay | Admitting: Gastroenterology

## 2020-01-11 ENCOUNTER — Encounter: Payer: Self-pay | Admitting: Internal Medicine

## 2020-01-11 ENCOUNTER — Ambulatory Visit: Payer: Medicare HMO | Admitting: Internal Medicine

## 2020-01-11 VITALS — BP 140/82 | HR 86 | Ht 65.0 in | Wt 161.0 lb

## 2020-01-11 DIAGNOSIS — E041 Nontoxic single thyroid nodule: Secondary | ICD-10-CM

## 2020-01-11 DIAGNOSIS — E063 Autoimmune thyroiditis: Secondary | ICD-10-CM | POA: Diagnosis not present

## 2020-01-11 LAB — TSH: TSH: 3.77 u[IU]/mL (ref 0.35–4.50)

## 2020-01-11 LAB — T4, FREE: Free T4: 0.95 ng/dL (ref 0.60–1.60)

## 2020-01-11 MED ORDER — LEVOTHYROXINE SODIUM 50 MCG PO TABS: 50.0000 ug | ORAL_TABLET | Freq: Every day | ORAL | 3 refills | Status: DC

## 2020-01-11 NOTE — Progress Notes (Signed)
Subjective:     Patient ID: Cindy Dickson, female   DOB: July 08, 1951, 69 y.o.   MRN: [id number]  This visit occurred during the SARS-CoV-2 public health emergency.  Safety protocols were in place, including screening questions prior to the visit, additional usage of staff PPE, and extensive cleaning of exam room while observing appropriate contact time as indicated for disinfecting solutions.   HPI Cindy Dickson is a pleasant 69 y.o. woman, returning for f/u for left thyroid nodule and Hashimoto's Hypothyroidism. Last visit 1 year ago.  Reviewed and addended history: Patient saw Dr. Olevia Perches for GERD, mild esophageal stricture - status post dilation in 2007, with complete relief of symptoms, hiatal hernia, and ulcerative proctitis. During examination by Dr. Olevia Perches, patient was found to have an enlarged left side of the thyroid, and she was sent for a thyroid ultrasound.   Thyroid U/S (12/07/2012): inhomogeneous echogenicity, and also a large, solitary, solid, mass of 3.1 x 2.5 x 2.1 cm in the left thyroid lobe, without calcifications. There is no internal blood flow. She also had a left cervical lymph node measuring 9 x 5 x 7 mm (characterized as normal).   32/2025: FNA: FLUS (follicular lesion of undetermined significance)  09/2013: Repeat FNA: benign (she had a traumatic experience -[place])  Thyroid ultrasound (11/27/2014): Stable left thyroid nodule   Thyroid ultrasound (04/06/2017): Stable left thyroid nodule, other nodules are small and not worrisome, not requiring follow-up.   Thyroid ultrasound (02/03/2019): Stable nodules  Pt is on levothyroxine 50 mcg daily, taken: - in am - fasting - at least 30 min from b'fast - no Ca, Fe, + MVI at night and as needed PPIs - not on Biotin  She did not feel differently after we started levothyroxine, however, she chose to continue with it. Now on the 50 mcg, she feels much better.  Reviewed her TFTs: Lab Results  Component Value Date    TSH 5.84 (H) 01/11/2019   TSH 2.28 03/11/2017   TSH 2.75 04/17/2016   TSH 5.47 (H) 10/17/2015   TSH 2.80 11/28/2012   FREET4 0.68 01/11/2019   FREET4 0.76 03/11/2017   FREET4 0.75 04/17/2016   FREET4 0.77 10/17/2015  10/09/2015: (Dr. [name]) - TSH 6.99   Her TPO antibodies were elevated: Component     Latest Ref Rng 10/17/2015  Thyroperoxidase Ab SerPl-aCnc     <9 IU/mL 351 (H)   Pt denies: - feeling nodules in neck - hoarseness - choking - SOB with lying down She has a history of dysphagia and has had esophageal stretching in the past.  No FH of thyroid cancer. No h/o radiation tx to head or neck.  No seaweed or kelp. No recent contrast studies. No herbal supplements. No Biotin use. No recent steroids use.   Review of Systems Constitutional: no weight gain/no weight loss, no fatigue, no subjective hyperthermia, no subjective hypothermia Eyes: no blurry vision, no xerophthalmia ENT: no sore throat, + see HPI Cardiovascular: no CP/no SOB/no palpitations/no leg swelling Respiratory: no cough/no SOB/no wheezing Gastrointestinal: no N/no V/no D/no C/no acid reflux Musculoskeletal: no muscle aches/no joint aches Skin: no rashes, no hair loss Neurological: no tremors/no numbness/no tingling/no dizziness  I reviewed pt's medications, allergies, PMH, social hx, family hx, and changes were documented in the history of present illness. Otherwise, unchanged from my initial visit note.   Past Medical History:  Diagnosis Date  . Allergy    seasonal  . Arthritis    knee right  . Cataract   .  Esophageal stricture   . GERD (gastroesophageal reflux disease)   . Hiatal hernia   . History of artificial lens replacement    bilateral  . Thyroid disease    thyroid nodule, on medication for low thyroid function  . Ulcerative proctitis [facility])    Past Surgical History:  Procedure Laterality Date  . CATARACT EXTRACTION Bilateral    Torick lens Implant  . COLONOSCOPY    . DENTAL  SURGERY Right 06/2016   Laser surgery left side of mouth to excise inflammation and seal gum  . ESOPHAGOGASTRODUODENOSCOPY      History   Social History  . Marital Status: Married    Spouse Name: N/A    Number of Children: 1   Occupational History  . Self-employed   Social History Main Topics  . Smoking status: Never Smoker   . Alcohol Use: No  . Drug Use: No   Social History Narrative   Caffeine daily    Current Outpatient Medications on File Prior to Visit  Medication Sig Dispense Refill  . CALCIUM PO Take by mouth.    . Cholecalciferol (VITAMIN D PO) Take by mouth.    . levothyroxine (SYNTHROID) 25 MCG tablet TAKE 1 TABLET BY MOUTH EVERY DAY BEFORE BREAKFAST 90 tablet 3  . levothyroxine (SYNTHROID) 50 MCG tablet Take 1 tablet (50 mcg total) by mouth daily. 90 tablet 3  . Multiple Vitamins-Minerals (ADULT GUMMY) CHEW Chew 1 Units by mouth daily.    . pantoprazole (PROTONIX) 40 MG tablet TAKE 1 TABLET BY MOUTH EVERY DAY 90 tablet 0   Current Facility-Administered Medications on File Prior to Visit  Medication Dose Route Frequency Provider Last Rate Last Admin  . 0.9 %  sodium chloride infusion  500 mL Intravenous Continuous Nandigam, Kavitha V, MD       No Known Allergies  Family History  Problem Relation Age of Onset  . Stomach cancer Maternal Grandmother   . Colon polyps Mother   . Colon cancer Neg Hx   . Esophageal cancer Neg Hx   . Rectal cancer Neg Hx     Objective:   Physical Exam There were no vitals taken for this visit. There is no height or weight on file to calculate BMI. Wt Readings from Last 3 Encounters:  01/11/19 165 lb (74.8 kg)  07/08/17 162 lb (73.5 kg)  06/09/17 162 lb (73.5 kg)   Constitutional: normal weight, in NAD Eyes: PERRLA, EOMI, no exophthalmos ENT: moist mucous membranes, no thyromegaly, no cervical lymphadenopathy Cardiovascular: RRR, No MRG Respiratory: CTA B Gastrointestinal: abdomen soft, NT, ND, BS+ Musculoskeletal: no  deformities, strength intact in all 4 Skin: moist, warm, no rashes Neurological: no tremor with outstretched hands, DTR normal in all 4  Assessment:     1. Left thyroid nodule  - 12/07/2012: thyroid ultrasound with 3.1 x 2.5 x 2.1 cm in the left thyroid lobe, without calcifications. There is no internal blood flow. She also had the left cervical lymph node measuring 9 x 5 x 7 mm (with fatty hilum).  - FNA (12/29/2012): Adequacy Reason Satisfactory For Evaluation. Diagnosis THYROID, FINE NEEDLE ASPIRATION, LEFT LOBE: FOLLICULAR LESION OF UNDETERMINED SIGNIFICANCE. SEE COMMENT. COMMENT: THE SPECIMEN IS HYPERCELLULAR AND CONSISTS OF SMALL TO LARGE SIZED SHEETS OF FOLLICULAR EPITHELIAL CELLS WITH MILD CYTOLOGIC ATYPIA. THERE IS MINIMAL BACKGROUND COLLOID. BASED ON THESE FINDINGS, A FOLLICULAR LESION/NEOPLASM CAN NOT BE ENTIRELY RULED OUT. Enid Cutter MD Pathologist, Electronic Signature (Case signed 12/31/2012) Specimen Clinical Information Left large solitary mass 3.1  x 2.5 x 2.1 cm, Goiter on left side of neck  Called pt about FLUS results >> ordered repeat Bx in 6 months  - repeat FNA (10/06/2013): Adequacy Reason Satisfactory For Evaluation. Diagnosis THYROID, FINE NEEDLE ASPIRATION, LEFT (SPECIMEN 1 OF 1, COLLECTED ON 10/06/13): BENIGN. FINDINGS CONSISTENT WITH NON-NEOPLASTIC GOITER. Willeen Niece MD Pathologist, Electronic Signature (Case signed 10/09/2013)  - thyroid U/S (11/27/2014):  Right thyroid lobe: 3.9 x 1.8 x 1.7 cm. Diffusely heterogeneous right thyroid lobe. No discrete nodule.  Left thyroid lobe: 3.9 x 2.1 x 2.2 cm. Similar appearance of large solid and slightly hyperechoic mass occupying the majority of the mid and upper gland. Today the mass measures 3.3 x 2.3 x 2.1 cm compared to 3.1 x 2.5 x 2.1 cm previously. This subtle difference is within measurement error and not felt to be clinically significant.  Isthmus Thickness: 0.2 cm. Two small hypoechoic  nodules are present within the isthmus measuring 5 and 6 mm respectively. These are not discretely measured on the prior study, however in retrospect theyn appear unchanged.  Lymphadenopathy: No suspicious adenopathy.  IMPRESSION: No significant interval change in the dominant solid hyperechoic nodule occupying the majority of the left thyroid gland.  - thyroid U/S (04/06/2017): Parenchymal Echotexture: Moderately heterogenous Isthmus: 0.3 cm Right lobe: Ir 4.3 cm x 1.5 cm x 1.3 cm Left lobe: 5.0 cm x 2.4 cm x 2.4 cm ___________________________________________  Nodule # 1: Location: Isthmus; Mid Maximum size: 0.6 cm; Other 2 dimensions: 0.6 cm x 0.3 cm Composition: cannot determine (2) Echogenicity: hypoechoic (2) Nodule does not meet criteria for surveillance or biopsy _________________________________________________________  Nodule # 2: Location: Isthmus; Mid Maximum size: 0.6 cm; Other 2 dimensions: 0.3 cm x 0.5 cm Composition: cannot determine (2) Echogenicity: hypoechoic (2) Nodule does not meet criteria for surveillance or biopsy _________________________________________________________  Nodule # 3: Location: Left; Mid Maximum size: 3.3 cm; Other 2 dimensions: 2.4 cm x 2.4 cm (Previous: 3.3 cm x 2.3 cm x 2.0 cm) Composition: solid/almost completely solid (2) Echogenicity: isoechoic (1) Nodule has been previously biopsied with benign result. __________________________________________________  No adenopathy  IMPRESSION: Multinodular thyroid again demonstrated. Left thyroid nodule has been previously biopsied with benign result. No other thyroid nodule meets criteria for biopsy or surveillance, as designated by the newly established ACR TI-RADS criteria.  - thyroid U/S (02/03/2019): FINDINGS: Parenchymal Echotexture: Moderately heterogenous Isthmus: 0.3 cm thickness, stable Right lobe: 4.2 x 1.5 x 1 cm, previously 4.3 x 1.5 x 1.3 Left lobe: 5 x 2.5 x 2 cm,  previously 5 x 2.4 x 2.4 __________________________________________________  0.5 cm hypoechoic nodule without calcifications, isthmus, previously 0.6; This nodule does NOT meet TI-RADS criteria for biopsy or dedicated follow-up.  0.7 cm hypoechoic inferior isthmic nodule without calcifications, previously 0.6; This nodule does NOT meet TI-RADS criteria for biopsy or dedicated follow-up.  3.3 x 2.4 x 2.2 cm mid left nodule, previously 3.3 x 2.4 x 2.4; this was previously biopsied  IMPRESSION: 1. Mild thyromegaly with stable nodules. None meets criteria for biopsy or dedicated imaging follow-up.  The above is in keeping with the ACR TI-RADS recommendations - J Am Coll Radiol 2017;14:587-595.   Electronically Signed   By: Lucrezia Europe M.D.   On: 02/03/2019 08:26  2. Hashimoto's hypothyroidism    Plan:     1. L Thyroid nodule -Patient with history of a large left thyroid nodule, without neck compression symptoms -We biopsied the nodule x2 with initially at that [place] finding, then benign -Repeat thyroid  ultrasound in 2018 showed multinodular thyroid with stable dominant left thyroid nodule and 2 smaller nodules, not worrisome.  Another ultrasound obtained 02/03/2019 showed essentially the same results.  At this point, no further follow-up is needed -No neck compression symptoms other than dysphagia which is chronic and for which she had esophageal dilations in the past.  2. Hashimoto's hypothyroidism - latest thyroid labs reviewed with pt >> slightly elevated After which we increased the dose of levothyroxine to 50 mcg daily.  She did not return for labs afterwards. Lab Results  Component Value Date   TSH 5.84 (H) 01/11/2019   - she continues on LT4 50 mcg daily - pt feels great on this dose! - we discussed about taking the thyroid hormone every day, with water, >30 minutes before breakfast, separated by >4 hours from acid reflux medications, calcium, iron, multivitamins. Pt.  is taking it correctly. - will check thyroid tests today: TSH and fT4 - If labs are abnormal, she will need to return for repeat TFTs in 1.5 months  Needs labs drawn while laying down - butterfly needle.  Needs refills.   Component     Latest Ref Rng & Units 01/11/2020  T4,Free(Direct)     0.60 - 1.60 ng/dL 0.95  TSH     0.35 - 4.50 uIU/mL 3.77  Thyroid tests are normal.  We can continue with the same levothyroxine dose.  Philemon Kingdom, MD PhD [facility]

## 2020-01-11 NOTE — Patient Instructions (Signed)
Please stop at the lab. ° °Please continue Levothyroxine 50 mcg daily. ° °Take the thyroid hormone every day, with water, at least 30 minutes before breakfast, separated by at least 4 hours from: °- acid reflux medications °- calcium °- iron °- multivitamins ° °Please come back for a follow-up appointment in 1 year. °

## 2020-04-09 ENCOUNTER — Other Ambulatory Visit: Payer: Self-pay | Admitting: Gastroenterology

## 2021-01-14 ENCOUNTER — Telehealth: Payer: Self-pay | Admitting: Internal Medicine

## 2021-01-14 DIAGNOSIS — E063 Autoimmune thyroiditis: Secondary | ICD-10-CM

## 2021-01-14 NOTE — Telephone Encounter (Signed)
MEDICATION: levothyroxine  PHARMACY:   [id number] - [place], [place] - [address] AT [address] Phone:  [phone]  Fax:  [phone]      HAS THE PATIENT CONTACTED [facility]?  no  IS THIS A 90 DAY SUPPLY : yes  IS PATIENT OUT OF MEDICATION: not yet  IF NOT; HOW MUCH IS LEFT:   LAST APPOINTMENT DATE: @Visit  date not found  NEXT APPOINTMENT DATE:@9 /06/2021  DO WE HAVE YOUR PERMISSION TO LEAVE A DETAILED MESSAGE?:   **Let patient know to contact pharmacy at the end of the day to make sure medication is ready. **  ** Please notify patient to allow 48-72 hours to process**  **Encourage patient to contact the pharmacy for refills or they can request refills through [facility]**

## 2021-01-15 MED ORDER — LEVOTHYROXINE SODIUM 50 MCG PO TABS: 50.0000 ug | ORAL_TABLET | Freq: Every day | ORAL | 0 refills | Status: DC

## 2021-01-15 NOTE — Telephone Encounter (Signed)
60 day supply sent to preferred pharmacy.

## 2021-01-16 ENCOUNTER — Ambulatory Visit: Payer: Medicare HMO | Admitting: Internal Medicine

## 2021-02-18 ENCOUNTER — Other Ambulatory Visit: Payer: Self-pay | Admitting: Internal Medicine

## 2021-02-18 DIAGNOSIS — E063 Autoimmune thyroiditis: Secondary | ICD-10-CM

## 2021-03-24 ENCOUNTER — Ambulatory Visit: Payer: Medicare HMO | Admitting: Internal Medicine

## 2021-03-25 ENCOUNTER — Other Ambulatory Visit: Payer: Self-pay

## 2021-03-25 ENCOUNTER — Encounter: Payer: Self-pay | Admitting: Internal Medicine

## 2021-03-25 ENCOUNTER — Ambulatory Visit (INDEPENDENT_AMBULATORY_CARE_PROVIDER_SITE_OTHER): Payer: Medicare HMO | Admitting: Internal Medicine

## 2021-03-25 VITALS — BP 128/82 | HR 88 | Ht 65.0 in | Wt 163.4 lb

## 2021-03-25 DIAGNOSIS — E041 Nontoxic single thyroid nodule: Secondary | ICD-10-CM

## 2021-03-25 DIAGNOSIS — E038 Other specified hypothyroidism: Secondary | ICD-10-CM

## 2021-03-25 DIAGNOSIS — E063 Autoimmune thyroiditis: Secondary | ICD-10-CM | POA: Diagnosis not present

## 2021-03-25 LAB — TSH: TSH: 2.46 u[IU]/mL (ref 0.35–5.50)

## 2021-03-25 LAB — T4, FREE: Free T4: 0.81 ng/dL (ref 0.60–1.60)

## 2021-03-25 MED ORDER — LEVOTHYROXINE SODIUM 50 MCG PO TABS: 50.0000 ug | ORAL_TABLET | Freq: Every day | ORAL | 3 refills | Status: DC

## 2021-03-25 NOTE — Patient Instructions (Signed)
Please stop at the lab. ° °Please continue Levothyroxine 50 mcg daily. ° °Take the thyroid hormone every day, with water, at least 30 minutes before breakfast, separated by at least 4 hours from: °- acid reflux medications °- calcium °- iron °- multivitamins ° °Please come back for a follow-up appointment in 1 year. °

## 2021-03-25 NOTE — Progress Notes (Signed)
Subjective:     Patient ID: Cindy Dickson, female   DOB: 03/25/1951, 70 y.o.   MRN: [id number]  This visit occurred during the SARS-CoV-2 public health emergency.  Safety protocols were in place, including screening questions prior to the visit, additional usage of staff PPE, and extensive cleaning of exam room while observing appropriate contact time as indicated for disinfecting solutions.   HPI Cindy Dickson he is a pleasant 70 y.o. woman, returning for f/u for left thyroid nodule and Hashimoto's Hypothyroidism. Last visit 1 year and 2.5 months ago.  Interim history: She denies any problems swallowing, choking, neck pressure. She had a URI for the last 4 weeks: cough, congestion - was on ABx - stopped 10 days ago.  Reviewed history: Patient saw Dr. Olevia Perches for GERD, mild esophageal stricture - status post dilation in 2007, with complete relief of symptoms, hiatal hernia, and ulcerative proctitis. During examination by Dr. Olevia Perches, patient was found to have an enlarged left side of the thyroid, and she was sent for a thyroid ultrasound.   Thyroid U/S (12/07/2012): inhomogeneous echogenicity, and also a large, solitary, solid, mass of 3.1 x 2.5 x 2.1 cm in the left thyroid lobe, without calcifications. There is no internal blood flow. She also had a left cervical lymph node measuring 9 x 5 x 7 mm (characterized as normal).   99991111: FNA: FLUS (follicular lesion of undetermined significance)  09/2013: Repeat FNA: benign (she had a traumatic experience -)  Thyroid ultrasound (11/27/2014): Stable left thyroid nodule   Thyroid ultrasound (04/06/2017): Stable left thyroid nodule, other nodules are small and not worrisome, not requiring follow-up.   Thyroid ultrasound (02/03/2019): Stable nodules  Pt is on levothyroxine 50 mcg daily, taken: - in am - fasting - at least 30 min from b'fast - no Ca, Fe, + MVI at night and as needed PPIs - not on Biotin - + vitamin C, D, Zn  She  did not feel differently after we started levothyroxine, however, she chose to continue with it.  She felt better after we increased the dose to 50 mcg daily.  Reviewed her TFTs: Lab Results  Component Value Date   TSH 3.77 01/11/2020   TSH 5.84 (H) 01/11/2019   TSH 2.28 03/11/2017   TSH 2.75 04/17/2016   TSH 5.47 (H) 10/17/2015   TSH 2.80 11/28/2012   FREET4 0.95 01/11/2020   FREET4 0.68 01/11/2019   FREET4 0.76 03/11/2017   FREET4 0.75 04/17/2016   FREET4 0.77 10/17/2015  10/09/2015: (Dr. [name]) - TSH 6.99   Her TPO antibodies were elevated: Component     Latest Ref Rng 10/17/2015  Thyroperoxidase Ab SerPl-aCnc     <9 IU/mL 351 (H)   Pt denies: - feeling nodules in neck - hoarseness - choking - SOB with lying down She has a history of dysphagia and has had esophageal stretching in the past.  No FH of thyroid cancer. No h/o radiation tx to head or neck.  No herbal supplements. No Biotin use. No recent steroids use.   Review of Systems Constitutional: no weight gain/no weight loss, no fatigue, no subjective hyperthermia, no subjective hypothermia Eyes: no blurry vision, no xerophthalmia ENT: no sore throat, + see HPI Cardiovascular: no CP/no SOB/no palpitations/no leg swelling Respiratory: no cough/no SOB/no wheezing Gastrointestinal: no N/no V/no D/no C/no acid reflux Musculoskeletal: no muscle aches/no joint aches Skin: no rashes, no hair loss Neurological: no tremors/no numbness/no tingling/no dizziness  I reviewed pt's medications, allergies, PMH, social hx, family  hx, and changes were documented in the history of present illness. Otherwise, unchanged from my initial visit note.   Past Medical History:  Diagnosis Date   Allergy    seasonal   Arthritis    knee right   Cataract    Esophageal stricture    GERD (gastroesophageal reflux disease)    Hiatal hernia    History of artificial lens replacement    bilateral   Thyroid disease    thyroid nodule, on  medication for low thyroid function   Ulcerative proctitis Cindy Dickson)    Past Surgical History:  Procedure Laterality Date   CATARACT EXTRACTION Bilateral    Torick lens Implant   COLONOSCOPY     DENTAL SURGERY Right 06/2016   Laser surgery left side of mouth to excise inflammation and seal gum   ESOPHAGOGASTRODUODENOSCOPY      History   Social History   Marital Status: Married    Spouse Name: N/A    Number of Children: 1   Occupational History   Self-employed   Social History Main Topics   Smoking status: Never Smoker    Alcohol Use: No   Drug Use: No   Social History Narrative   Caffeine daily    Current Outpatient Medications on File Prior to Visit  Medication Sig Dispense Refill   CALCIUM PO Take by mouth.     Cholecalciferol (VITAMIN D PO) Take by mouth.     levothyroxine (SYNTHROID) 50 MCG tablet TAKE 1 TABLET BY MOUTH EVERY DAY 90 tablet 3   Multiple Vitamins-Minerals (ADULT GUMMY) CHEW Chew 1 Units by mouth daily.     pantoprazole (PROTONIX) 40 MG tablet TAKE 1 TABLET BY MOUTH EVERY DAY 90 tablet 0   Current Facility-Administered Medications on File Prior to Visit  Medication Dose Route Frequency Provider Last Rate Last Admin   0.9 %  sodium chloride infusion  500 mL Intravenous Continuous Nandigam, Kavitha V, MD       No Known Allergies  Family History  Problem Relation Age of Onset   Stomach cancer Maternal Grandmother    Colon polyps Mother    Colon cancer Neg Hx    Esophageal cancer Neg Hx    Rectal cancer Neg Hx     Objective:   Physical Exam There were no vitals taken for this visit. There is no height or weight on file to calculate BMI. Wt Readings from Last 3 Encounters:  01/11/20 161 lb (73 kg)  01/11/19 165 lb (74.8 kg)  07/08/17 162 lb (73.5 kg)   Constitutional: normal weight, in NAD Eyes: PERRLA, EOMI, no exophthalmos ENT: moist mucous membranes, no thyromegaly, no cervical lymphadenopathy Cardiovascular: RRR, No MRG Respiratory: CTA  B Gastrointestinal: abdomen soft, NT, ND, BS+ Musculoskeletal: no deformities, strength intact in all 4 Skin: moist, warm, no rashes Neurological: no tremor with outstretched hands, DTR normal in all 4  Assessment:     1. Left thyroid nodule  - 12/07/2012: thyroid ultrasound with 3.1 x 2.5 x 2.1 cm in the left thyroid lobe, without calcifications. There is no internal blood flow. She also had the left cervical lymph node measuring 9 x 5 x 7 mm (with fatty hilum).  - FNA (12/29/2012): Adequacy Reason Satisfactory For Evaluation. Diagnosis THYROID, FINE NEEDLE ASPIRATION, LEFT LOBE: FOLLICULAR LESION OF UNDETERMINED SIGNIFICANCE. SEE COMMENT. COMMENT: THE SPECIMEN IS HYPERCELLULAR AND CONSISTS OF SMALL TO LARGE SIZED SHEETS OF FOLLICULAR EPITHELIAL CELLS WITH MILD CYTOLOGIC ATYPIA. THERE IS MINIMAL BACKGROUND COLLOID. BASED ON THESE FINDINGS,  A FOLLICULAR LESION/NEOPLASM CAN NOT BE ENTIRELY RULED OUT. Enid Cutter MD Pathologist, Electronic Signature (Case signed 12/31/2012) Specimen Clinical Information Left large solitary mass 3.1 x 2.5 x 2.1 cm, Goiter on left side of neck  Called pt about FLUS results >> ordered repeat Bx in 6 months  - repeat FNA (10/06/2013): Adequacy Reason Satisfactory For Evaluation. Diagnosis THYROID, FINE NEEDLE ASPIRATION, LEFT (SPECIMEN 1 OF 1, COLLECTED ON 10/06/13): BENIGN. FINDINGS CONSISTENT WITH NON-NEOPLASTIC GOITER. Willeen Niece MD Pathologist, Electronic Signature (Case signed 10/09/2013)  - thyroid U/S (11/27/2014): Right thyroid lobe: 3.9 x 1.8 x 1.7 cm. Diffusely heterogeneous right thyroid lobe. No discrete nodule. Left thyroid lobe: 3.9 x 2.1 x 2.2 cm. Similar appearance of large solid and slightly hyperechoic mass occupying the majority of the mid and upper gland. Today the mass measures 3.3 x 2.3 x 2.1 cm compared to 3.1 x 2.5 x 2.1 cm previously. This subtle difference is within measurement error and not felt to be clinically  significant. Isthmus Thickness: 0.2 cm. Two small hypoechoic nodules are present within the isthmus measuring 5 and 6 mm respectively. These are not discretely measured on the prior study, however in retrospect theyn appear unchanged. Lymphadenopathy: No suspicious adenopathy.  IMPRESSION: No significant interval change in the dominant solid hyperechoic nodule occupying the majority of the left thyroid gland.  - thyroid U/S (04/06/2017): Parenchymal Echotexture: Moderately heterogenous Isthmus: 0.3 cm Right lobe: Ir 4.3 cm x 1.5 cm x 1.3 cm Left lobe: 5.0 cm x 2.4 cm x 2.4 cm ___________________________________________   Nodule # 1: Location: Isthmus; Mid Maximum size: 0.6 cm; Other 2 dimensions: 0.6 cm x 0.3 cm Composition: cannot determine (2) Echogenicity: hypoechoic (2) Nodule does not meet criteria for surveillance or biopsy _________________________________________________________   Nodule # 2: Location: Isthmus; Mid Maximum size: 0.6 cm; Other 2 dimensions: 0.3 cm x 0.5 cm Composition: cannot determine (2) Echogenicity: hypoechoic (2) Nodule does not meet criteria for surveillance or biopsy _________________________________________________________   Nodule # 3: Location: Left; Mid Maximum size: 3.3 cm; Other 2 dimensions: 2.4 cm x 2.4 cm (Previous: 3.3 cm x 2.3 cm x 2.0 cm) Composition: solid/almost completely solid (2) Echogenicity: isoechoic (1) Nodule has been previously biopsied with benign result.  __________________________________________________   No adenopathy   IMPRESSION: Multinodular thyroid again demonstrated. Left thyroid nodule has been previously biopsied with benign result. No other thyroid nodule meets criteria for biopsy or surveillance, as designated by the newly established ACR TI-RADS criteria.  - thyroid U/S (02/03/2019): FINDINGS: Parenchymal Echotexture: Moderately heterogenous Isthmus: 0.3 cm thickness, stable Right lobe: 4.2 x 1.5 x 1  cm, previously 4.3 x 1.5 x 1.3 Left lobe: 5 x 2.5 x 2 cm, previously 5 x 2.4 x 2.4  __________________________________________________   0.5 cm hypoechoic nodule without calcifications, isthmus, previously 0.6; This nodule does NOT meet TI-RADS criteria for biopsy or dedicated follow-up.   0.7 cm hypoechoic inferior isthmic nodule without calcifications, previously 0.6; This nodule does NOT meet TI-RADS criteria for biopsy or dedicated follow-up.   3.3 x 2.4 x 2.2 cm mid left nodule, previously 3.3 x 2.4 x 2.4; this was previously biopsied   IMPRESSION: 1. Mild thyromegaly with stable nodules. None meets criteria for biopsy or dedicated imaging follow-up.   The above is in keeping with the ACR TI-RADS recommendations - J Am Coll Radiol 2017;14:587-595.     Electronically Signed   By: Lucrezia Europe M.D.   On: 02/03/2019 08:26  2. Hashimoto's hypothyroidism    Plan:  1. L Thyroid nodule -Patient with history of a large left thyroid nodule, without neck compression symptoms -We biopsied the nodule x2 with initial inconclusive biopsy (FLUS), but then benign results -Repeat thyroid ultrasound in 2018 showed multinodular thyroid with stable dominant left thyroid nodule and 2 smaller nodules, not worrisome.  Another ultrasound obtained in 2020 showed essentially the same results. -We discussed that at this point, if she has no neck compression symptoms, no further follow-up is needed -At last visit, she mentioned dysphagia, which is chronic and for which she had esophageal dilations in the past.  At this visit, she has some voice changes and at today's visit her voice is hoarse - probably due to her upper respiratory infection  -We will continue to follow her clinically for this  2. Hashimoto's hypothyroidism - latest thyroid labs reviewed with pt. >> normal: Lab Results  Component Value Date   TSH 3.77 01/11/2020  - she continues on LT4 50 mcg daily - pt feels good on this  dose. - we discussed about taking the thyroid hormone every day, with water, >30 minutes before breakfast, separated by >4 hours from acid reflux medications, calcium, iron, multivitamins. Pt. is taking it correctly. - will check thyroid tests today: TSH and fT4 - If labs are abnormal, she will need to return for repeat TFTs in 1.5 months  Needs labs drawn while laying down - butterfly needle.  Needs refills - 90 day supply.  Component     Latest Ref Rng & Units 03/25/2021  TSH     0.35 - 5.50 uIU/mL 2.46  T4,Free(Direct)     0.60 - 1.60 ng/dL 0.81  Thyroid tests are normal.  Philemon Kingdom, MD PhD [facility]

## 2021-05-08 ENCOUNTER — Ambulatory Visit: Payer: Medicare HMO | Admitting: Family Medicine

## 2022-03-25 ENCOUNTER — Ambulatory Visit: Payer: Medicare HMO | Admitting: Internal Medicine

## 2022-03-27 ENCOUNTER — Other Ambulatory Visit: Payer: Self-pay | Admitting: Internal Medicine

## 2022-03-27 DIAGNOSIS — E063 Autoimmune thyroiditis: Secondary | ICD-10-CM

## 2022-05-14 ENCOUNTER — Ambulatory Visit: Payer: Medicare HMO | Admitting: Internal Medicine

## 2022-06-30 ENCOUNTER — Ambulatory Visit (INDEPENDENT_AMBULATORY_CARE_PROVIDER_SITE_OTHER): Payer: Medicare HMO | Admitting: Internal Medicine

## 2022-06-30 ENCOUNTER — Encounter: Payer: Self-pay | Admitting: Internal Medicine

## 2022-06-30 VITALS — BP 128/82 | HR 80 | Ht 65.0 in | Wt 159.8 lb

## 2022-06-30 DIAGNOSIS — E041 Nontoxic single thyroid nodule: Secondary | ICD-10-CM | POA: Diagnosis not present

## 2022-06-30 DIAGNOSIS — E063 Autoimmune thyroiditis: Secondary | ICD-10-CM

## 2022-06-30 DIAGNOSIS — E038 Other specified hypothyroidism: Secondary | ICD-10-CM | POA: Diagnosis not present

## 2022-06-30 LAB — TSH: TSH: 2.58 u[IU]/mL (ref 0.35–5.50)

## 2022-06-30 LAB — T4, FREE: Free T4: 0.8 ng/dL (ref 0.60–1.60)

## 2022-06-30 NOTE — Patient Instructions (Signed)
Please stop at the lab. ° °Please continue Levothyroxine 50 mcg daily. ° °Take the thyroid hormone every day, with water, at least 30 minutes before breakfast, separated by at least 4 hours from: °- acid reflux medications °- calcium °- iron °- multivitamins ° °Please come back for a follow-up appointment in 1 year. °

## 2022-06-30 NOTE — Progress Notes (Unsigned)
Subjective:     Patient ID: Cindy Dickson, female   DOB: 1951/03/14, 71 y.o.   MRN: [id number]  HPI Cindy Dickson he is a pleasant 71 y.o. woman, returning for f/u for left thyroid nodule and Hashimoto's Hypothyroidism. Last visit 1 year and 3 months ago.  Interim history: She denies Cindy problems swallowing, choking, neck pressure. She retired in 12/2021. She is getting more rest and even napping in the pm.  Reviewed history: Patient saw Dr. Olevia Perches for GERD, mild esophageal stricture - status post dilation in 2007, with complete relief of symptoms, hiatal hernia, and ulcerative proctitis. During examination by Dr. Olevia Perches, patient was found to have an enlarged left side of the thyroid, and she was sent for a thyroid ultrasound.   Thyroid U/S (12/07/2012): inhomogeneous echogenicity, and also a large, solitary, solid, mass of 3.1 x 2.5 x 2.1 cm in the left thyroid lobe, without calcifications. There is no internal blood flow. She also had a left cervical lymph node measuring 9 x 5 x 7 mm (characterized as normal).   95/6213: FNA: FLUS (follicular lesion of undetermined significance)  09/2013: Repeat FNA: benign (she had a traumatic experience -[place])  Thyroid ultrasound (11/27/2014): Stable left thyroid nodule   Thyroid ultrasound (04/06/2017): Stable left thyroid nodule, other nodules are small and not worrisome, not requiring follow-up.   Thyroid ultrasound (02/03/2019): Stable nodules  Pt is on levothyroxine 50 mcg daily, taken: - in am - fasting - at least 45 min from b'fast - no Ca, Fe, + MVI at night - + PPIs prn at night - not on Biotin - + vitamin C, D, Zn at night  She did not feel differently after we started levothyroxine, however, she chose to continue with it.  She felt better after we increased the dose to 50 mcg daily.  Reviewed her TFTs: Lab Results  Component Value Date   TSH 2.46 03/25/2021   TSH 3.77 01/11/2020   TSH 5.84 (H) 01/11/2019   TSH 2.28  03/11/2017   TSH 2.75 04/17/2016   TSH 5.47 (H) 10/17/2015   FREET4 0.81 03/25/2021   FREET4 0.95 01/11/2020   FREET4 0.68 01/11/2019   FREET4 0.76 03/11/2017   FREET4 0.75 04/17/2016   FREET4 0.77 10/17/2015  10/09/2015: (Dr. [name]) - TSH 6.99   Her TPO antibodies were elevated: Component     Latest Ref Rng 10/17/2015  Thyroperoxidase Ab SerPl-aCnc     <9 IU/mL 351 (H)   Pt denies: - feeling nodules in neck - hoarseness - choking She has a history of dysphagia and has had esophageal stretching in the past.  No FH of thyroid cancer. No h/o radiation tx to head or neck. No herbal supplements. No Biotin use. No recent steroids use.   Review of Systems + see HPI  I reviewed pt's medications, allergies, PMH, social hx, family hx, and changes were documented in the history of present illness. Otherwise, unchanged from my initial visit note.  Past Medical History:  Diagnosis Date   Allergy    seasonal   Arthritis    knee right   Cataract    Esophageal stricture    GERD (gastroesophageal reflux disease)    Hiatal hernia    History of artificial lens replacement    bilateral   Thyroid disease    thyroid nodule, on medication for low thyroid function   Ulcerative proctitis [facility])    Past Surgical History:  Procedure Laterality Date   CATARACT EXTRACTION Bilateral  Torick lens Implant   COLONOSCOPY     DENTAL SURGERY Right 06/2016   Laser surgery left side of mouth to excise inflammation and seal gum   ESOPHAGOGASTRODUODENOSCOPY      History   Social History   Marital Status: Married    Spouse Name: N/A    Number of Children: 1   Occupational History   Self-employed   Social History Main Topics   Smoking status: Never Smoker    Alcohol Use: No   Drug Use: No   Social History Narrative   Caffeine daily    Current Outpatient Medications on File Prior to Visit  Medication Sig Dispense Refill   CALCIUM PO Take by mouth.     Cholecalciferol (VITAMIN D  PO) Take by mouth.     levothyroxine (SYNTHROID) 50 MCG tablet TAKE 1 TABLET BY MOUTH EVERY DAY 90 tablet 3   Multiple Vitamins-Minerals (ADULT GUMMY) CHEW Chew 1 Units by mouth daily.     pantoprazole (PROTONIX) 40 MG tablet TAKE 1 TABLET BY MOUTH EVERY DAY 90 tablet 0   Current Facility-Administered Medications on File Prior to Visit  Medication Dose Route Frequency Provider Last Rate Last Admin   0.9 %  sodium chloride infusion  500 mL Intravenous Continuous Nandigam, Venia Minks, MD       No Known Allergies  Family History  Problem Relation Age of Onset   Stomach cancer Maternal Grandmother    Colon polyps Mother    Colon cancer Neg Hx    Esophageal cancer Neg Hx    Rectal cancer Neg Hx     Objective:   Physical Exam BP 128/82 (BP Location: Right Arm, Patient Position: Sitting, Cuff Size: Normal)   Pulse 80   Ht '5\' 5"'$  (1.651 m)   Wt 159 lb 12.8 oz (72.5 kg)   SpO2 99%   BMI 26.59 kg/m   Wt Readings from Last 3 Encounters:  06/30/22 159 lb 12.8 oz (72.5 kg)  03/25/21 163 lb 6.4 oz (74.1 kg)  01/11/20 161 lb (73 kg)   Constitutional: normal weight, in NAD Eyes:  EOMI, no exophthalmos ENT: no neck masses, no cervical lymphadenopathy Cardiovascular: RRR, No MRG Respiratory: CTA B Musculoskeletal: no deformities Skin:no rashes Neurological: no tremor with outstretched hands  Assessment:     1. Left thyroid nodule  - 12/07/2012: thyroid ultrasound with 3.1 x 2.5 x 2.1 cm in the left thyroid lobe, without calcifications. There is no internal blood flow. She also had the left cervical lymph node measuring 9 x 5 x 7 mm (with fatty hilum).  - FNA (12/29/2012): Adequacy Reason Satisfactory For Evaluation. Diagnosis THYROID, FINE NEEDLE ASPIRATION, LEFT LOBE: FOLLICULAR LESION OF UNDETERMINED SIGNIFICANCE. SEE COMMENT. COMMENT: THE SPECIMEN IS HYPERCELLULAR AND CONSISTS OF SMALL TO LARGE SIZED SHEETS OF FOLLICULAR EPITHELIAL CELLS WITH MILD CYTOLOGIC ATYPIA. THERE IS  MINIMAL BACKGROUND COLLOID. BASED ON THESE FINDINGS, A FOLLICULAR LESION/NEOPLASM CAN NOT BE ENTIRELY RULED OUT. Enid Cutter MD Pathologist, Electronic Signature (Case signed 12/31/2012) Specimen Clinical Information Left large solitary mass 3.1 x 2.5 x 2.1 cm, Goiter on left side of neck  Called pt about FLUS results >> ordered repeat Bx in 6 months  - repeat FNA (10/06/2013): Adequacy Reason Satisfactory For Evaluation. Diagnosis THYROID, FINE NEEDLE ASPIRATION, LEFT (SPECIMEN 1 OF 1, COLLECTED ON 10/06/13): BENIGN. FINDINGS CONSISTENT WITH NON-NEOPLASTIC GOITER. Willeen Niece MD Pathologist, Electronic Signature (Case signed 10/09/2013)  - thyroid U/S (11/27/2014): Right thyroid lobe: 3.9 x 1.8 x 1.7 cm. Diffusely heterogeneous  right thyroid lobe. No discrete nodule. Left thyroid lobe: 3.9 x 2.1 x 2.2 cm. Similar appearance of large solid and slightly hyperechoic mass occupying the majority of the mid and upper gland. Today the mass measures 3.3 x 2.3 x 2.1 cm compared to 3.1 x 2.5 x 2.1 cm previously. This subtle difference is within measurement error and not felt to be clinically significant. Isthmus Thickness: 0.2 cm. Two small hypoechoic nodules are present within the isthmus measuring 5 and 6 mm respectively. These are not discretely measured on the prior study, however in retrospect theyn appear unchanged. Lymphadenopathy: No suspicious adenopathy.  IMPRESSION: No significant interval change in the dominant solid hyperechoic nodule occupying the majority of the left thyroid gland.  - thyroid U/S (04/06/2017): Parenchymal Echotexture: Moderately heterogenous Isthmus: 0.3 cm Right lobe: Ir 4.3 cm x 1.5 cm x 1.3 cm Left lobe: 5.0 cm x 2.4 cm x 2.4 cm ___________________________________________   Nodule # 1: Location: Isthmus; Mid Maximum size: 0.6 cm; Other 2 dimensions: 0.6 cm x 0.3 cm Composition: cannot determine (2) Echogenicity: hypoechoic (2) Nodule does not meet  criteria for surveillance or biopsy _________________________________________________________   Nodule # 2: Location: Isthmus; Mid Maximum size: 0.6 cm; Other 2 dimensions: 0.3 cm x 0.5 cm Composition: cannot determine (2) Echogenicity: hypoechoic (2) Nodule does not meet criteria for surveillance or biopsy _________________________________________________________   Nodule # 3: Location: Left; Mid Maximum size: 3.3 cm; Other 2 dimensions: 2.4 cm x 2.4 cm (Previous: 3.3 cm x 2.3 cm x 2.0 cm) Composition: solid/almost completely solid (2) Echogenicity: isoechoic (1) Nodule has been previously biopsied with benign result.  __________________________________________________   No adenopathy   IMPRESSION: Multinodular thyroid again demonstrated. Left thyroid nodule has been previously biopsied with benign result. No other thyroid nodule meets criteria for biopsy or surveillance, as designated by the newly established ACR TI-RADS criteria.  - thyroid U/S (02/03/2019): FINDINGS: Parenchymal Echotexture: Moderately heterogenous Isthmus: 0.3 cm thickness, stable Right lobe: 4.2 x 1.5 x 1 cm, previously 4.3 x 1.5 x 1.3 Left lobe: 5 x 2.5 x 2 cm, previously 5 x 2.4 x 2.4  __________________________________________________   0.5 cm hypoechoic nodule without calcifications, isthmus, previously 0.6; This nodule does NOT meet TI-RADS criteria for biopsy or dedicated follow-up.   0.7 cm hypoechoic inferior isthmic nodule without calcifications, previously 0.6; This nodule does NOT meet TI-RADS criteria for biopsy or dedicated follow-up.   3.3 x 2.4 x 2.2 cm mid left nodule, previously 3.3 x 2.4 x 2.4; this was previously biopsied   IMPRESSION: 1. Mild thyromegaly with stable nodules. None meets criteria for biopsy or dedicated imaging follow-up.  2. Hashimoto's hypothyroidism    Plan:     1. L Thyroid nodule -Patient with history of a large left thyroid nodule, without neck  compression symptoms -We biopsied the nodule x 2 with initial inconclusive results (FLUS), but then benign results.   -repeat thyroid ultrasound in 2018 showed multinodular thyroid with stable dominant left thyroid nodule and 2 smaller nodules, not worrisome.  Another thyroid ultrasound on 09/18/2018 showed essentially the same results -we are  following her clinically; but plan to repeat a last U/S in 1 year -She does have a history of dysphagia for which she had esophageal dilations in the past, but she did not require days recently. -No neck compression symptoms at today's visit -I will see her back in a year  2. Hashimoto's hypothyroidism - latest thyroid labs reviewed with pt. >> normal: Lab Results  Component  Value Date   TSH 2.46 03/25/2021  - she continues on LT4 50 mcg daily - pt feels good on this dose.  She had an episode of fatigue approximately 3 months ago, but this improved spontaneously. - we discussed about taking the thyroid hormone every day, with water, >30 minutes before breakfast, separated by >4 hours from acid reflux medications, calcium, iron, multivitamins. Pt. is taking it correctly. - will check thyroid tests today: TSH and fT4 - If labs are abnormal, she will need to return for repeat TFTs in 1.5 months  Needs labs drawn while laying down - butterfly needle.  Philemon Kingdom, MD PhD [facility] Endocrinology

## 2023-04-01 ENCOUNTER — Encounter: Payer: Self-pay | Admitting: Gastroenterology

## 2023-04-01 ENCOUNTER — Other Ambulatory Visit: Payer: Self-pay | Admitting: Internal Medicine

## 2023-04-01 DIAGNOSIS — E063 Autoimmune thyroiditis: Secondary | ICD-10-CM

## 2023-06-22 ENCOUNTER — Ambulatory Visit: Payer: Medicare HMO | Admitting: Internal Medicine

## 2023-06-22 ENCOUNTER — Encounter: Payer: Self-pay | Admitting: Internal Medicine

## 2023-06-22 VITALS — BP 136/70 | HR 92 | Ht 65.0 in | Wt 164.0 lb

## 2023-06-22 DIAGNOSIS — E041 Nontoxic single thyroid nodule: Secondary | ICD-10-CM | POA: Diagnosis not present

## 2023-06-22 DIAGNOSIS — E063 Autoimmune thyroiditis: Secondary | ICD-10-CM

## 2023-06-22 NOTE — Progress Notes (Addendum)
Subjective:     Patient ID: Cindy Dickson, female   DOB: July 10, 1951, 72 y.o.   MRN: [id number]  HPI Cindy Dickson he is a pleasant 72 y.o. woman, returning for f/u for left thyroid nodule and Hashimoto's Hypothyroidism. Last visit 1 year ago.  Interim history: She has problems swallowing, thinks she may need an esophageal dilation.  She will see Cindy Dickson soon. She feels more tired in last 8 months.  She wonders  if her levothyroxine dose needs to be increased.  Reviewed history: Patient saw Cindy Dickson for GERD, mild esophageal stricture - status post dilation in 2007, with complete relief of symptoms, hiatal hernia, and ulcerative proctitis. During examination by Cindy Dickson, patient was found to have an enlarged left side of the thyroid, and she was sent for a thyroid ultrasound.   Thyroid U/S (12/07/2012): inhomogeneous echogenicity, and also a large, solitary, solid, mass of 3.1 x 2.5 x 2.1 cm in the left thyroid lobe, without calcifications. There is no internal blood flow. She also had a left cervical lymph node measuring 9 x 5 x 7 mm (characterized as normal).   12/2012: FNA: FLUS (follicular lesion of undetermined significance)  09/2013: Repeat FNA: benign (she had a traumatic experience -Cindy Dickson)  Thyroid ultrasound (11/27/2014): Stable left thyroid nodule   Thyroid ultrasound (04/06/2017): Stable left thyroid nodule, other nodules are small and not worrisome, not requiring follow-up.   Thyroid ultrasound (02/03/2019): Stable nodules  Pt is on levothyroxine 50 mcg daily, taken: - in am (7 am) - fasting - at least 1h from b'fast - no Ca, Fe, + MVI at night - + PPIs prn at night (seldom) - not on Biotin, steroids - + vitamin C, D, Zn Honey and cinnamon - at night.  She did not feel differently after we started levothyroxine, however, she chose to continue with it.  She felt better after we increased the dose to 50 mcg daily.  Reviewed her TFTs: Lab Results   Component Value Date   TSH 2.58 06/30/2022   TSH 2.46 03/25/2021   TSH 3.77 01/11/2020   TSH 5.84 (H) 01/11/2019   TSH 2.28 03/11/2017   TSH 2.75 04/17/2016   FREET4 0.80 06/30/2022   FREET4 0.81 03/25/2021   FREET4 0.95 01/11/2020   FREET4 0.68 01/11/2019   FREET4 0.76 03/11/2017   FREET4 0.75 04/17/2016  10/09/2015: (Dr. Vickey Dickson) - TSH 6.99   Her TPO antibodies were elevated: Component     Latest Ref Rng 10/17/2015  Thyroperoxidase Ab SerPl-aCnc     <9 IU/mL 351 (H)   Pt denies: - feeling nodules in neck - hoarseness - choking She has a history of dysphagia and has had esophageal stretching in the past.  She again started to experience this.  No FH of thyroid cancer. No h/o radiation tx to head or neck. No herbal supplements. No Biotin use. No recent steroids use.   Review of Systems + see HPI  I reviewed pt's medications, allergies, PMH, social hx, family hx, and changes were documented in the history of present illness. Otherwise, unchanged from my initial visit note.  Past Medical History:  Diagnosis Date   Allergy    seasonal   Arthritis    knee right   Cataract    Esophageal stricture    GERD (gastroesophageal reflux disease)    Hiatal hernia    History of artificial lens replacement    bilateral   Thyroid disease    thyroid nodule, on medication for  low thyroid function   Ulcerative proctitis (HCC)    Past Surgical History:  Procedure Laterality Date   CATARACT EXTRACTION Bilateral    Torick lens Implant   COLONOSCOPY     DENTAL SURGERY Right 06/2016   Laser surgery left side of mouth to excise inflammation and seal gum   ESOPHAGOGASTRODUODENOSCOPY      History   Social History   Marital Status: Married    Spouse Name: N/A    Number of Children: 1   Occupational History   Self-employed   Social History Main Topics   Smoking status: Never Smoker    Alcohol Use: No   Drug Use: No   Social History Narrative   Caffeine daily    Current  Outpatient Medications on File Prior to Visit  Medication Sig Dispense Refill   CALCIUM PO Take by mouth.     Cholecalciferol (VITAMIN D PO) Take by mouth.     levothyroxine (SYNTHROID) 50 MCG tablet TAKE 1 TABLET BY MOUTH EVERY DAY 90 tablet 0   Multiple Vitamins-Minerals (ADULT GUMMY) CHEW Chew 1 Units by mouth daily.     Multiple Vitamins-Minerals (ZINC PO) Take by mouth.     pantoprazole (PROTONIX) 40 MG tablet TAKE 1 TABLET BY MOUTH EVERY DAY 90 tablet 0   Current Facility-Administered Medications on File Prior to Visit  Medication Dose Route Frequency Provider Last Rate Last Admin   0.9 %  sodium chloride infusion  500 mL Intravenous Continuous Cindy Dickson, Cindy V, MD       No Known Allergies  Family History  Problem Relation Age of Onset   Stomach cancer Maternal Grandmother    Colon polyps Mother    Colon cancer Neg Hx    Esophageal cancer Neg Hx    Rectal cancer Neg Hx     Objective:   Physical Exam There were no vitals taken for this visit.  Wt Readings from Last 3 Encounters:  06/30/22 159 lb 12.8 oz (72.5 kg)  03/25/21 163 lb 6.4 oz (74.1 kg)  01/11/20 161 lb (73 kg)   Constitutional: normal weight, in NAD Eyes:  EOMI, no exophthalmos ENT: no neck masses, no cervical lymphadenopathy Cardiovascular: RRR, No MRG Respiratory: CTA B Musculoskeletal: no deformities Skin:no rashes Neurological: no tremor with outstretched hands  Assessment:     1. Left thyroid nodule  - 12/07/2012: thyroid ultrasound with 3.1 x 2.5 x 2.1 cm in the left thyroid lobe, without calcifications. There is no internal blood flow. She also had the left cervical lymph node measuring 9 x 5 x 7 mm (with fatty hilum).   - FNA (12/29/2012): Adequacy Reason Satisfactory For Evaluation. Diagnosis THYROID, FINE NEEDLE ASPIRATION, LEFT LOBE: FOLLICULAR LESION OF UNDETERMINED SIGNIFICANCE. SEE COMMENT. COMMENT: THE SPECIMEN IS HYPERCELLULAR AND CONSISTS OF SMALL TO LARGE SIZED SHEETS  OF FOLLICULAR EPITHELIAL CELLS WITH MILD CYTOLOGIC ATYPIA. THERE IS MINIMAL BACKGROUND COLLOID. BASED ON THESE FINDINGS, A FOLLICULAR LESION/NEOPLASM CAN NOT BE ENTIRELY RULED OUT.  Called pt about FLUS results >> ordered repeat Bx in 6 months  - repeat FNA (10/06/2013): FINDINGS CONSISTENT WITH NON-NEOPLASTIC GOITER.  - thyroid U/S (11/27/2014): Right thyroid lobe: 3.9 x 1.8 x 1.7 cm. Diffusely heterogeneous right thyroid lobe. No discrete nodule. Left thyroid lobe: 3.9 x 2.1 x 2.2 cm. Similar appearance of large solid and slightly hyperechoic mass occupying the majority of the mid and upper gland. Today the mass measures 3.3 x 2.3 x 2.1 cm compared to 3.1 x 2.5 x 2.1 cm previously.  This subtle difference is within measurement error and not felt to be clinically significant. Isthmus Thickness: 0.2 cm. Two small hypoechoic nodules are present within the isthmus measuring 5 and 6 mm respectively. These are not discretely measured on the prior study, however in retrospect theyn appear unchanged. Lymphadenopathy: No suspicious adenopathy.  IMPRESSION: No significant interval change in the dominant solid hyperechoic nodule occupying the majority of the left thyroid gland.  - thyroid U/S (04/06/2017): Parenchymal Echotexture: Moderately heterogenous Isthmus: 0.3 cm Right lobe: Ir 4.3 cm x 1.5 cm x 1.3 cm Left lobe: 5.0 cm x 2.4 cm x 2.4 cm ___________________________________________   Nodule # 1: Location: Isthmus; Mid Maximum size: 0.6 cm; Other 2 dimensions: 0.6 cm x 0.3 cm Composition: cannot determine (2) Echogenicity: hypoechoic (2) Nodule does not meet criteria for surveillance or biopsy _________________________________________________________   Nodule # 2: Location: Isthmus; Mid Maximum size: 0.6 cm; Other 2 dimensions: 0.3 cm x 0.5 cm Composition: cannot determine (2) Echogenicity: hypoechoic (2) Nodule does not meet criteria for surveillance or  biopsy _________________________________________________________   Nodule # 3: Location: Left; Mid Maximum size: 3.3 cm; Other 2 dimensions: 2.4 cm x 2.4 cm (Previous: 3.3 cm x 2.3 cm x 2.0 cm) Composition: solid/almost completely solid (2) Echogenicity: isoechoic (1) Nodule has been previously biopsied with benign result.  __________________________________________________   No adenopathy   IMPRESSION: Multinodular thyroid again demonstrated. Left thyroid nodule has been previously biopsied with benign result. No other thyroid nodule meets criteria for biopsy or surveillance, as designated by the newly established ACR TI-RADS criteria.  - thyroid U/S (02/03/2019): FINDINGS: Parenchymal Echotexture: Moderately heterogenous Isthmus: 0.3 cm thickness, stable Right lobe: 4.2 x 1.5 x 1 cm, previously 4.3 x 1.5 x 1.3 Left lobe: 5 x 2.5 x 2 cm, previously 5 x 2.4 x 2.4  __________________________________________________   0.5 cm hypoechoic nodule without calcifications, isthmus, previously 0.6; This nodule does NOT meet TI-RADS criteria for biopsy or dedicated follow-up.   0.7 cm hypoechoic inferior isthmic nodule without calcifications, previously 0.6; This nodule does NOT meet TI-RADS criteria for biopsy or dedicated follow-up.   3.3 x 2.4 x 2.2 cm mid left nodule, previously 3.3 x 2.4 x 2.4; this was previously biopsied   IMPRESSION: 1. Mild thyromegaly with stable nodules. None meets criteria for biopsy or dedicated imaging follow-up.  2. Hashimoto's hypothyroidism    Plan:     1. L Thyroid nodule -She has a history of a large left thyroid nodule, without neck compression symptoms -We biopsied the nodule x 2 with initially inconclusive results (FLUS), but then benign results. -repeat thyroid ultrasound in 2018 showed multinodular thyroid with stable dominant left thyroid nodule and 2 smaller nodules, not worrisome.  Another thyroid ultrasound in 09/2018 showed essentially  the same results. -We are following her clinically but we can check another ultrasound now, almost 5 years from the previous -She has a history of dysphagia for which she had esophageal dilations in the past, and she feels that she may need another 1.  She will see her gastroenterologist soon. -No neck compression symptoms at today's visit or masses felt on palpation of her neck -I will see her back in 1 year  2. Hashimoto's hypothyroidism - latest thyroid labs reviewed with pt. >> normal: Lab Results  Component Value Date   TSH 2.58 06/30/2022  - she continues on LT4 50 mcg daily - pt was feeling good on this dose but in the last few months she started to be more  fatigued.  She wonders whether she needs a higher dose or not.  We discussed about side effect of overtreatment with LT4 to include cardiovascular overstimulation and increased bone resorption.  If the TSH is trending up, we could increase the dose, but otherwise, I feel that it would be safer to continue at the same dose. - we discussed about taking the thyroid hormone every day, with water, >30 minutes before breakfast, separated by >4 hours from acid reflux medications, calcium, iron, multivitamins. Pt. is taking it correctly. - will check thyroid tests today: TSH and fT4 - If labs are abnormal, she will need to return for repeat TFTs in 1.5 months - OTW, I will see him back in a year  Needs labs drawn while laying down - butterfly needle.  Needs refills.  Component     Latest Ref Rng 06/22/2023  TSH     0.40 - 4.50 mIU/L 3.91   T4,Free(Direct)     0.8 - 1.8 ng/dL 1.2   TSH is higher in the target range so we could increase the dose of levothyroxine to 75 mcg daily have you back for labs in 1.5 months.  Thyroid U/S (06/29/2023): Parenchymal Echotexture: Moderately heterogenous  Isthmus: 0.6 cm  Right lobe: 4.0 x 1.3 x 1.1 cm  Left lobe: 4.4 x 2.3 x 2.6 cm  _________________________________________________________    Estimated total number of nodules >/= 1 cm: 1  _________________________________________________________   Nodule # 1:  Prior biopsy: Yes  Location: Left; Mid  Maximum size: 3.2 cm; Other 2 dimensions: 2.2 x 2.2 cm, previously, 3.3 cm  Composition: solid/almost completely solid (2)  Echogenicity: isoechoic (1) Stable left mid thyroid nodule measuring approximately 3.2 cm in maximum diameter. This nodule has now been stable for 10 years.  _________________________________________________________   No enlarged or abnormal appearing lymph nodes are identified.   IMPRESSION: Stable 3.2 cm left mid thyroid nodule which has been stable for 10 years. This nodule has been previously sampled twice.  Carlus Pavlov, MD PhD [facility]

## 2023-06-22 NOTE — Patient Instructions (Addendum)
Please stop at the lab.  Please continue Levothyroxine 50 mcg daily.  Take the thyroid hormone every day, with water, at least 30 minutes before breakfast, separated by at least 4 hours from: - acid reflux medications - calcium - iron - multivitamins  We will check a thyroid U/S.  Please come back for a follow-up appointment in 1 year.

## 2023-06-23 LAB — T4, FREE: Free T4: 1.2 ng/dL (ref 0.8–1.8)

## 2023-06-23 LAB — TSH: TSH: 3.91 m[IU]/L (ref 0.40–4.50)

## 2023-06-23 MED ORDER — LEVOTHYROXINE SODIUM 75 MCG PO TABS: 75.0000 ug | ORAL_TABLET | Freq: Every day | ORAL | 3 refills | Status: DC

## 2023-06-29 ENCOUNTER — Ambulatory Visit
Admission: RE | Admit: 2023-06-29 | Discharge: 2023-06-29 | Disposition: A | Discharge status: Home / Self Care | Payer: Medicare HMO | Source: Ambulatory Visit | Attending: Internal Medicine | Admitting: Internal Medicine

## 2023-06-29 DIAGNOSIS — E041 Nontoxic single thyroid nodule: Secondary | ICD-10-CM

## 2023-07-26 ENCOUNTER — Encounter: Payer: Self-pay | Admitting: Gastroenterology

## 2023-07-26 ENCOUNTER — Ambulatory Visit: Payer: Medicare HMO | Admitting: Gastroenterology

## 2023-07-26 VITALS — BP 120/80 | HR 98 | Ht 65.0 in | Wt 164.0 lb

## 2023-07-26 DIAGNOSIS — K219 Gastro-esophageal reflux disease without esophagitis: Secondary | ICD-10-CM

## 2023-07-26 DIAGNOSIS — R1319 Other dysphagia: Secondary | ICD-10-CM

## 2023-07-26 DIAGNOSIS — R1310 Dysphagia, unspecified: Secondary | ICD-10-CM | POA: Diagnosis not present

## 2023-07-26 DIAGNOSIS — K449 Diaphragmatic hernia without obstruction or gangrene: Secondary | ICD-10-CM | POA: Diagnosis not present

## 2023-07-26 NOTE — Progress Notes (Signed)
 Cindy Dickson    [id number]    1950/11/04  Primary Care Physician:Patient, No Pcp Per  Referring Physician: No referring provider defined for this encounter.   Chief complaint:  Dysphagia  Discussed the use of AI scribe software for clinical note transcription with the patient, who gave verbal consent to proceed.  History of Present Illness   73 year old man pleasant female, with a history of hiatal hernia and acid reflux, presents with complaints of difficulty swallowing. They describe a sensation of a knot or nodule in their throat, primarily noticed during swallowing. The patient also reports a recent episode of severe coughing in early October, which they suspect may have caused some damage or rupture, although they did not seek medical attention at the time.  The patient denies any recent food impactions, but expresses concern about the potential for this to occur if the current issue is not addressed. They have been managing their symptoms by eating smaller bites and avoiding certain foods like rice and noodles.  The patient also has a history of thyroid  issues, which they believe has contributed to recent weight gain. Despite efforts to eat better and exercise, they report minimal weight loss.  The patient has a known fear of needles, which has been a barrier to certain procedures in the past. They express a preference for arm placement of IVs, citing previous negative experiences with hand placement.  The patient has had previous endoscopies and colonoscopies, the most recent of which was seven years ago. They are agreeable to another endoscopy to investigate the current swallowing issue, but are hesitant about undergoing another colonoscopy due to the unpleasant prep process. They indicate a willingness to consider another colonoscopy in the future, possibly next year.      Colonoscopy 06/09/2017 for positive cologuard - Preparation of the colon was  inadequate. - One 4 mm polyp in the ascending colon, removed with a cold snare. Resected and retrieved. - Localized moderate inflammation was found in the recto- sigmoid colon secondary to proctitis. Biopsied. - Several biopsies were obtained in the rectum, in the descending colon, in the transverse colon, in the ascending colon and in the cecum.  EGD 07/26/2015 Mild stricture in the distal esophagus at 33cm associated with mild tortousity of the esophagus proximal to the stricture. TTS balloon dilation performed 18-19-20mm with minimal amount of heme. Large Hiatal hernia ~7cm. Mild antral erythema s/p random gastric biopsies. Small duodenal clean based ulcer ~1-57mm s/p random duodenal biopsies 1. Surgical [P], duodenal bulb and 2nd portion of suodenum - BENIGN DUODENAL MUCOSA WITH FEATURES OF PEPTIC INJURY. - NO FEATURES OF SPRUE, ACTIVE INFLAMMATION OR GRANULOMAS. 2. Surgical [P], gastric antrum and gastric body - SLIGHT CHRONIC GASTRITIS. - WARTHIN-STARRY STAIN NEGATIVE FOR HELICOBACTER PYLORI. - NO INTESTINAL METAPLASIA, DYSPLASIA, OR MALIGNANCY   Colonoscopy 05/2006 Evidence of proctitis otherwise normal exam. 2. COLON, RECTOSIGMOID, BIOPSIES: BENIGN COLONIC MUCOSA WITH   SMALL BENIGN LYMPHOID AGGREGATES. NO ACTIVE INFLAMMATION,   CHRONIC CHANGES, OR GRANULOMAS.    3. COLON, RECTUM, BIOPSIES: CHRONIC ACTIVE COLITIS WITH ENLARGED   LYMPHOID AGGREGATE.   Outpatient Encounter Medications as of 07/26/2023  Medication Sig   levothyroxine  (SYNTHROID ) 75 MCG tablet Take 1 tablet (75 mcg total) by mouth daily before breakfast.   Multiple Vitamins-Minerals (ADULT GUMMY) CHEW Chew 1 Units by mouth daily.   Multiple Vitamins-Minerals (ZINC PO) Take by mouth.   pantoprazole  (PROTONIX ) 40 MG tablet TAKE 1 TABLET BY MOUTH EVERY  DAY   CALCIUM PO Take by mouth. (Patient not taking: Reported on 07/26/2023)   Cholecalciferol (VITAMIN D PO) Take by mouth. (Patient not taking: Reported on 07/26/2023)    Facility-Administered Encounter Medications as of 07/26/2023  Medication   0.9 %  sodium chloride  infusion    Allergies as of 07/26/2023   (No Known Allergies)    Past Medical History:  Diagnosis Date   Allergy    seasonal   Arthritis    knee right   Cataract    Esophageal stricture    GERD (gastroesophageal reflux disease)    Hiatal hernia    History of artificial lens replacement    bilateral   Thyroid  disease    thyroid  nodule, on medication for low thyroid  function   Ulcerative proctitis (HCC)     Past Surgical History:  Procedure Laterality Date   CATARACT EXTRACTION Bilateral    Torick lens Implant   COLONOSCOPY     DENTAL SURGERY Right 06/2016   Laser surgery left side of mouth to excise inflammation and seal gum   ESOPHAGOGASTRODUODENOSCOPY      Family History  Problem Relation Age of Onset   Colon polyps Mother    Stomach cancer Maternal Grandmother    Colon cancer Neg Hx    Esophageal cancer Neg Hx    Liver disease Neg Hx     Social History   Socioeconomic History   Marital status: Married    Spouse name: Not on file   Number of children: 1   Years of education: Not on file   Highest education level: Not on file  Occupational History   Occupation: real estate   Occupation: retired  Tobacco Use   Smoking status: Never   Smokeless tobacco: Never  Vaping Use   Vaping status: Never Used  Substance and Sexual Activity   Alcohol use: No    Alcohol/week: 0.0 standard drinks of alcohol   Drug use: No   Sexual activity: Not on file  Other Topics Concern   Not on file  Social History Narrative   Caffeine daily    Social Drivers of Corporate Investment Banker Strain: Not on file  Food Insecurity: Not on file  Transportation Needs: Not on file  Physical Activity: Not on file  Stress: Not on file  Social Connections: Not on file  Intimate Partner Violence: Not on file      Review of systems: All other review of systems negative  except as mentioned in the HPI.   Physical Exam: Vitals:   07/26/23 1334  BP: 120/80  Pulse: 98   Body mass index is 27.29 kg/m. Gen:      No acute distress HEENT:  sclera anicteric CV: s1s2 rrr, no murmur Lungs: B/l clear. Abd:      soft, non-tender; no palpable masses, no distension Ext:    No edema Neuro: alert and oriented x 3 Psych: normal mood and affect  Data Reviewed:  Reviewed labs, radiology imaging, old records and pertinent past GI work up     Assessment and Plan    Dysphagia Patient reports a sensation of a knot or nodule in the throat, primarily noticed during swallowing. Possible causes include irritation, ulceration, or stricture. Recent history of coughing and acid reflux may have exacerbated the condition. -Schedule endoscopy for August 20, 2023, to evaluate the cause of dysphagia. -If necessary, perform biopsies and esophageal dilation during the procedure.  Gastroesophageal Reflux Disease (GERD) Patient has a history of GERD  and hiatal hernia. Recent coughing episode may have increased acid reflux and contributed to the current dysphagia. -Continue current management strategies, including dietary modifications.  Colonoscopy Patient due for colonoscopy, last performed in 2017. Patient has expressed reluctance due to previous negative experiences with bowel preparation. -Plan to schedule colonoscopy after resolution of dysphagia. -Consider pill form of bowel preparation to improve patient compliance.  Thyroid -related weight gain Patient expresses frustration with weight gain related to thyroid  condition. -Encourage continued healthy eating and exercise habits. -Reassure patient that overall health is more important than specific weight number.        The patient was provided an opportunity to ask questions and all were answered. The patient agreed with the plan and demonstrated an understanding of the instructions.  LOIS Wilkie Mcgee , MD     CC: No ref. provider found

## 2023-07-26 NOTE — Patient Instructions (Addendum)
 VISIT SUMMARY:  During today's visit, we discussed your recent difficulty swallowing, which you described as a sensation of a knot or nodule in your throat. We also reviewed your history of acid reflux, thyroid  issues, and your concerns about weight gain. Additionally, we talked about your past experiences with endoscopies and colonoscopies, and your preferences for future procedures.  YOUR PLAN:  -DYSPHAGIA: Dysphagia means difficulty swallowing. It can be caused by irritation, ulceration, or narrowing of the esophagus. We have scheduled an endoscopy for August 20, 2023, to investigate the cause of your symptoms. If needed, we will perform biopsies and esophageal dilation during the procedure.  -GASTROESOPHAGEAL REFLUX DISEASE (GERD): GERD is a condition where stomach acid frequently flows back into the esophagus, causing irritation. Continue with your current dietary modifications to manage this condition.  -COLONOSCOPY: A colonoscopy is a procedure to examine the colon for any abnormalities. Since your last colonoscopy was in 2017 with inadequate prep, you are due for another one. We will plan to schedule this after we address your swallowing issue. We can consider a pill form of bowel preparation to make the process easier for you.  -THYROID -RELATED WEIGHT GAIN: Thyroid  issues can affect your metabolism and lead to weight gain. Continue with your healthy eating and exercise habits. Remember, your overall health is more important than the specific number on the scale.  INSTRUCTIONS:  Please follow up for the endoscopy on August 20, 2023. After we address your swallowing issue, we will plan for your next colonoscopy. Continue with your current dietary modifications for GERD and maintain your healthy lifestyle for managing your thyroid -related weight gain.  You have been scheduled for an endoscopy. Please follow written instructions given to you at your visit today.  If you use inhalers (even  only as needed), please bring them with you on the day of your procedure.  If you take any of the following medications, they will need to be adjusted prior to your procedure:   DO NOT TAKE 7 DAYS PRIOR TO TEST- Trulicity (dulaglutide) Ozempic, Wegovy (semaglutide) Mounjaro (tirzepatide) Bydureon Bcise (exanatide extended release)  DO NOT TAKE 1 DAY PRIOR TO YOUR TEST Rybelsus (semaglutide) Adlyxin (lixisenatide) Victoza (liraglutide) Byetta (exanatide) ___________________________________________________________________________  Due to recent changes in healthcare laws, you may see the results of your imaging and laboratory studies on MyChart before your provider has had a chance to review them.  We understand that in some cases there may be results that are confusing or concerning to you. Not all laboratory results come back in the same time frame and the provider may be waiting for multiple results in order to interpret others.  Please give us  48 hours in order for your provider to thoroughly review all the results before contacting the office for clarification of your results.     I appreciate the  opportunity to care for you  Thank You   Kavitha Nandigam , MD

## 2023-07-29 ENCOUNTER — Encounter: Payer: Self-pay | Admitting: Gastroenterology

## 2023-08-11 ENCOUNTER — Encounter: Payer: Self-pay | Admitting: Gastroenterology

## 2023-08-20 ENCOUNTER — Ambulatory Visit (AMBULATORY_SURGERY_CENTER): Payer: Medicare HMO | Admitting: Gastroenterology

## 2023-08-20 ENCOUNTER — Encounter: Payer: Self-pay | Admitting: Gastroenterology

## 2023-08-20 VITALS — BP 136/77 | HR 74 | Temp 98.1°F | Resp 16 | Ht 65.0 in | Wt 164.0 lb

## 2023-08-20 DIAGNOSIS — K2950 Unspecified chronic gastritis without bleeding: Secondary | ICD-10-CM

## 2023-08-20 DIAGNOSIS — K219 Gastro-esophageal reflux disease without esophagitis: Secondary | ICD-10-CM

## 2023-08-20 DIAGNOSIS — K222 Esophageal obstruction: Secondary | ICD-10-CM | POA: Diagnosis not present

## 2023-08-20 DIAGNOSIS — K319 Disease of stomach and duodenum, unspecified: Secondary | ICD-10-CM | POA: Diagnosis not present

## 2023-08-20 DIAGNOSIS — R1319 Other dysphagia: Secondary | ICD-10-CM

## 2023-08-20 DIAGNOSIS — K449 Diaphragmatic hernia without obstruction or gangrene: Secondary | ICD-10-CM | POA: Diagnosis not present

## 2023-08-20 MED ORDER — PANTOPRAZOLE SODIUM 40 MG PO TBEC: 40.0000 mg | DELAYED_RELEASE_TABLET | Freq: Every day | ORAL | 3 refills | Status: AC

## 2023-08-20 MED ORDER — SODIUM CHLORIDE 0.9 % IV SOLN: 500.0000 mL | Freq: Once | INTRAVENOUS | Status: AC

## 2023-08-20 NOTE — Progress Notes (Signed)
 Pt's states no medical or surgical changes since previsit or office visit.

## 2023-08-20 NOTE — Patient Instructions (Signed)
   FOLLOW DILATATION DIET TODAY ( HANDOUT GIVEN TO YOU)  PANTOPRAZOLE  40 MG DAILY FOR 90 DAYS WITH 3 REFILLS   FOLLOW ANTI REFLUX REGIMEN - HANDOUT GIVEN TO YOU TODAY  AWAIT BIOPSY RESULTS    SCHEDULE COLONOSCOPY NEXT AVAILABLE APPOINTMENT  Continue previous diet & medications    YOU HAD AN ENDOSCOPIC PROCEDURE TODAY AT THE [facility]:   Refer to the procedure report that was given to you for any specific questions about what was found during the examination.  If the procedure report does not answer your questions, please call your gastroenterologist to clarify.  If you requested that your care partner not be given the details of your procedure findings, then the procedure report has been included in a sealed envelope for you to review at your convenience later.  YOU SHOULD EXPECT: Some feelings of bloating in the abdomen. Passage of more gas than usual.  Walking can help get rid of the air that was put into your GI tract during the procedure and reduce the bloating. If you had a lower endoscopy (such as a colonoscopy or flexible sigmoidoscopy) you may notice spotting of blood in your stool or on the toilet paper. If you underwent a bowel prep for your procedure, you may not have a normal bowel movement for a few days.  Please Note:  You might notice some irritation and congestion in your nose or some drainage.  This is from the oxygen used during your procedure.  There is no need for concern and it should clear up in a day or so.  SYMPTOMS TO REPORT IMMEDIATELY:   Following upper endoscopy (EGD)  Vomiting of blood or coffee ground material  New chest pain or pain under the shoulder blades  Painful or persistently difficult swallowing  New shortness of breath  Fever of 100F or higher  Black, tarry-looking stools  For urgent or emergent issues, a gastroenterologist can be reached at any hour by calling [phone]. Do not use MyChart messaging for urgent concerns.     DIET:  We do recommend a small meal at first, but then you may proceed to your regular diet.  Drink plenty of fluids but you should avoid alcoholic beverages for 24 hours.  ACTIVITY:  You should plan to take it easy for the rest of today and you should NOT DRIVE or use heavy machinery until tomorrow (because of the sedation medicines used during the test).    FOLLOW UP: Our staff will call the number listed on your records the next business day following your procedure.  We will call around 7:15- 8:00 am to check on you and address any questions or concerns that you may have regarding the information given to you following your procedure. If we do not reach you, we will leave a message.     If any biopsies were taken you will be contacted by phone or by letter within the next 1-3 weeks.  Please call us  at [phone] if you have not heard about the biopsies in 3 weeks.    SIGNATURES/CONFIDENTIALITY: You and/or your care partner have signed paperwork which will be entered into your electronic medical record.  These signatures attest to the fact that that the information above on your After Visit Summary has been reviewed and is understood.  Full responsibility of the confidentiality of this discharge information lies with you and/or your care-partner.

## 2023-08-20 NOTE — Op Note (Signed)
 [facility] Patient Name: Cindy Dickson Procedure Date: 08/20/2023 10:18 AM MRN: [id number] Endoscopist: Gustav ALONSO Mcgee , MD, [id number] Age: 73 Referring MD:  Date of Birth: 1951/03/14 Gender: Female Account #: [id number] Procedure:                Upper GI endoscopy Indications:              Dysphagia Medicines:                Monitored Anesthesia Care Procedure:                Pre-Anesthesia Assessment:                           - Prior to the procedure, a History and Physical                            was performed, and patient medications and                            allergies were reviewed. The patient's tolerance of                            previous anesthesia was also reviewed. The risks                            and benefits of the procedure and the sedation                            options and risks were discussed with the patient.                            All questions were answered, and informed consent                            was obtained. Prior Anticoagulants: The patient has                            taken no anticoagulant or antiplatelet agents. ASA                            Grade Assessment: II - A patient with mild systemic                            disease. After reviewing the risks and benefits,                            the patient was deemed in satisfactory condition to                            undergo the procedure.                           After obtaining informed consent, the endoscope was  passed under direct vision. Throughout the                            procedure, the patient's blood pressure, pulse, and                            oxygen saturations were monitored continuously. The                            Olympus Scope [id number] was introduced through the                            mouth, and advanced to the second part of duodenum.                            The upper GI endoscopy was  accomplished without                            difficulty. The patient tolerated the procedure                            well. Scope In: Scope Out: Findings:                 The Z-line was regular and was found 32 cm from the                            incisors.                           One benign-appearing, intrinsic mild stenosis was                            found 31 to 32 cm from the incisors. This stenosis                            measured 1.8 cm (inner diameter) x less than one cm                            (in length). The stenosis was traversed. A TTS                            dilator was passed through the scope. Dilation with                            an 18-19-20 mm x 8 cm CRE balloon dilator was                            performed to 20 mm. The dilation site was examined                            following endoscope reinsertion and showed no  change.                           A 6 cm hiatal hernia was present.                           LA Grade A (one or more mucosal breaks less than 5                            mm, not extending between tops of 2 mucosal folds)                            esophagitis with no bleeding was found 25 to 32 cm                            from the incisors. Biopsies were taken with a cold                            forceps for histology.                           Patchy mild inflammation characterized by                            congestion (edema), erythema and friability was                            found in the prepyloric region of the stomach.                            Biopsies were taken with a cold forceps for                            Helicobacter pylori testing.                           The cardia and gastric fundus were normal on                            retroflexion.                           The examined duodenum was normal. Complications:            No immediate complications. Estimated Blood  Loss:     Estimated blood loss was minimal. Impression:               - Z-line regular, 32 cm from the incisors.                           - Benign-appearing esophageal stenosis. Dilated.                           - 6 cm hiatal hernia.                           -  LA Grade A reflux esophagitis with no bleeding.                            Biopsied.                           - Gastritis. Biopsied.                           - Normal examined duodenum. Recommendation:           - Patient has a contact number available for                            emergencies. The signs and symptoms of potential                            delayed complications were discussed with the                            patient. Return to normal activities tomorrow.                            Written discharge instructions were provided to the                            patient.                           - Resume previous diet.                           - Continue present medications.                           - Await pathology results.                           - Follow an antireflux regimen.                           - Use Protonix  (pantoprazole ) 40 mg PO daily. Rx                            for 90 days with 3 refills                           - Schedule colonoscopy next available appointment                            for surveillance Gustav ALONSO Mcgee, MD 08/20/2023 10:44:39 AM This report has been signed electronically.

## 2023-08-20 NOTE — Progress Notes (Signed)
 Called to room to assist during endoscopic procedure.  Patient ID and intended procedure confirmed with present staff. Received instructions for my participation in the procedure from the performing physician.

## 2023-08-20 NOTE — Progress Notes (Signed)
 PT WILL CALL TO SCHEDULE COLONOSCOPY IF SHE DECIDES TO HAVE IT DONE

## 2023-08-20 NOTE — Progress Notes (Signed)
 Please refer to office visit note 07/26/23. No additional changes in H&P Patient is appropriate for planned procedure(s) and anesthesia in an ambulatory setting  K. Scherry Ran , MD [phone]

## 2023-08-20 NOTE — Progress Notes (Signed)
 Sedate, gd SR, tolerated procedure well, VSS, report to RN

## 2023-08-23 ENCOUNTER — Telehealth: Payer: Self-pay

## 2023-08-23 NOTE — Telephone Encounter (Signed)
  Follow up Call-     08/20/2023    9:33 AM  Call back number  Post procedure Call Back phone  # [phone]  Permission to leave phone message Yes     Patient questions:  Do you have a fever, pain , or abdominal swelling? No. Pain Score  0 *  Have you tolerated food without any problems? No.  Have you been able to return to your normal activities? No.  Do you have any questions about your discharge instructions: Diet   No. Medications  No. Follow up visit  No.  Do you have questions or concerns about your Care? No.  Actions: * If pain score is 4 or above: No action needed, pain <4.

## 2023-08-24 LAB — SURGICAL PATHOLOGY

## 2023-09-17 ENCOUNTER — Telehealth: Payer: Self-pay | Admitting: *Deleted

## 2023-09-17 NOTE — Telephone Encounter (Signed)
 Per Dr Lavon Paganini patient needs a colonoscopy scheduled after her EGD  For History of colon polyps     Left message for patient to contact the office to schedule her colonoscopy with a pre-op appointment and she does not need to speak to me a [facility] can set her up.   DX HX OF COLON POLYPS

## 2023-09-23 ENCOUNTER — Other Ambulatory Visit: Payer: Self-pay | Admitting: Internal Medicine

## 2023-09-23 DIAGNOSIS — E063 Autoimmune thyroiditis: Secondary | ICD-10-CM

## 2023-09-29 ENCOUNTER — Encounter: Payer: Self-pay | Admitting: Gastroenterology

## 2023-12-30 ENCOUNTER — Other Ambulatory Visit: Payer: Self-pay | Admitting: Internal Medicine

## 2023-12-30 DIAGNOSIS — E063 Autoimmune thyroiditis: Secondary | ICD-10-CM

## 2023-12-31 ENCOUNTER — Telehealth: Payer: Self-pay | Admitting: Internal Medicine

## 2023-12-31 DIAGNOSIS — E063 Autoimmune thyroiditis: Secondary | ICD-10-CM

## 2023-12-31 MED ORDER — LEVOTHYROXINE SODIUM 75 MCG PO TABS: 75.0000 ug | ORAL_TABLET | Freq: Every day | ORAL | 1 refills | Status: DC

## 2023-12-31 NOTE — Telephone Encounter (Signed)
 MEDICATION: levothyroxine  (SYNTHROID ) 75 MCG tablet   PHARMACY:    [id number] - [place], [place] - [address] AT [address] [facility] [address] (Ph: [phone])    HAS THE PATIENT CONTACTED THEIR PHARMACY?  Yes  IS THIS A 90 DAY SUPPLY : Yes  IS PATIENT OUT OF MEDICATION: Yes  IF NOT; HOW MUCH IS LEFT:   LAST APPOINTMENT DATE: @1 /13/2025  NEXT APPOINTMENT DATE:@12 /05/2024  DO WE HAVE YOUR PERMISSION TO LEAVE A DETAILED MESSAGE?:Yes  OTHER COMMENTS: [facility] that [facility] not [facility]ill request.  Patient would like a phone call back after prescription is re- sen   **Let patient know to contact pharmacy at the end of the day to make sure medication is ready. **  ** Please notify patient to allow 48-72 hours to process**  **Encourage patient to contact the pharmacy for refills or they can request refills through [facility]**

## 2024-06-13 ENCOUNTER — Other Ambulatory Visit: Payer: Self-pay | Admitting: Internal Medicine

## 2024-06-13 DIAGNOSIS — E063 Autoimmune thyroiditis: Secondary | ICD-10-CM

## 2024-06-21 ENCOUNTER — Telehealth: Payer: Self-pay

## 2024-06-21 NOTE — Telephone Encounter (Signed)
 RN contacted patient regarding her being overdue for her next colonoscopy. When RN asked if she would like to schedule that, patient stated, NO. I am 72 and do not need to have any more.

## 2024-06-22 ENCOUNTER — Ambulatory Visit: Payer: Medicare HMO | Admitting: Internal Medicine

## 2024-06-26 ENCOUNTER — Encounter: Payer: Self-pay | Admitting: Internal Medicine

## 2024-06-26 ENCOUNTER — Ambulatory Visit: Admitting: Internal Medicine

## 2024-06-26 VITALS — BP 144/84 | HR 101 | Ht 65.0 in | Wt 160.0 lb

## 2024-06-26 DIAGNOSIS — E041 Nontoxic single thyroid nodule: Secondary | ICD-10-CM | POA: Diagnosis not present

## 2024-06-26 DIAGNOSIS — E063 Autoimmune thyroiditis: Secondary | ICD-10-CM | POA: Diagnosis not present

## 2024-06-26 NOTE — Patient Instructions (Addendum)
Please stop at the lab.  Please continue Levothyroxine 75 mcg daily.  Take the thyroid hormone every day, with water, at least 30 minutes before breakfast, separated by at least 4 hours from: - acid reflux medications - calcium - iron - multivitamins  Please come back for a follow-up appointment in 1 year.

## 2024-06-26 NOTE — Progress Notes (Signed)
 Subjective:     Patient ID: Cindy Dickson, female   DOB: 09-Feb-1951, 73 y.o.   MRN: [id number]  HPI Cindy Dickson he is a pleasant 73 y.o. woman, returning for f/u for left thyroid  nodule and Hashimoto's Hypothyroidism. Last visit 1 year ago.  Interim history: At today's visit, she feels well, without complaints.  Reviewed history: Patient saw Dr. Obie for GERD, mild esophageal stricture - status post dilation in 2007, with complete relief of symptoms, hiatal hernia, and ulcerative proctitis. During examination by Dr. Obie, patient was found to have an enlarged left side of the thyroid , and she was sent for a thyroid  ultrasound.   Thyroid  U/S (12/07/2012): inhomogeneous echogenicity, and also a large, solitary, solid, mass of 3.1 x 2.5 x 2.1 cm in the left thyroid  lobe, without calcifications. There is no internal blood flow. She also had a left cervical lymph node measuring 9 x 5 x 7 mm (characterized as normal).   12/2012: FNA: FLUS (follicular lesion of undetermined significance)  09/2013: Repeat FNA: benign (she had a traumatic experience -[place])  Thyroid  ultrasound (11/27/2014): Stable left thyroid  nodule   Thyroid  ultrasound (04/06/2017): Stable left thyroid  nodule, other nodules are small and not worrisome, not requiring follow-up.   Thyroid  ultrasound (02/03/2019): Stable nodules   Thyroid  ultrasound  (06/29/2023): Stable left nodule  Pt is on levothyroxine  75 mcg daily, taken: - in am  - fasting - at least 1h from b'fast - no Ca, Fe, + MVI at night - + PPIs prn at night (seldom) - not on Biotin, steroids - + vitamin C, D. On Zn now, also.  She did not feel differently after we started levothyroxine , however, she chose to continue with it.  She felt better after we increased the dose to 50 mcg daily.  Reviewed her TFTs: Lab Results  Component Value Date   TSH 3.91 06/22/2023   TSH 2.58 06/30/2022   TSH 2.46 03/25/2021   TSH 3.77 01/11/2020   TSH 5.84 (H)  01/11/2019   TSH 2.28 03/11/2017   FREET4 1.2 06/22/2023   FREET4 0.80 06/30/2022   FREET4 0.81 03/25/2021   FREET4 0.95 01/11/2020   FREET4 0.68 01/11/2019   FREET4 0.76 03/11/2017  10/09/2015: (Dr. German) - TSH 6.99   Her TPO antibodies were elevated: Component     Latest Ref Rng 10/17/2015  Thyroperoxidase Ab SerPl-aCnc     <9 IU/mL 351 (H)   Pt denies: - feeling nodules in neck - hoarseness - choking She has a history of dysphagia and has had esophageal stretching in the past.  She again started to experience this before last visit.  She sees Dr. Nandigam - had another dilation 06/2023.Cindy Dickson  No FH of thyroid  cancer. No h/o radiation tx to head or neck. No herbal supplements. No Biotin use. No recent steroids use.   Review of Systems + see HPI  I reviewed pt's medications, allergies, PMH, social hx, family hx, and changes were documented in the history of present illness. Otherwise, unchanged from my initial visit note.  Past Medical History:  Diagnosis Date   Allergy    seasonal   Arthritis    knee right   Cataract    Esophageal stricture    GERD (gastroesophageal reflux disease)    Hiatal hernia    History of artificial lens replacement    bilateral   Thyroid  disease    thyroid  nodule, on medication for low thyroid  function   Ulcerative proctitis (HCC)    Past  Surgical History:  Procedure Laterality Date   CATARACT EXTRACTION Bilateral    Torick lens Implant   COLONOSCOPY     DENTAL SURGERY Right 06/2016   Laser surgery left side of mouth to excise inflammation and seal gum   ESOPHAGOGASTRODUODENOSCOPY      History   Social History   Marital Status: Married    Spouse Name: N/A    Number of Children: 1   Occupational History   Self-employed   Social History Main Topics   Smoking status: Never Smoker    Alcohol Use: No   Drug Use: No   Social History Narrative   Caffeine daily    Current Outpatient Medications on File Prior to Visit   Medication Sig Dispense Refill   CALCIUM PO Take by mouth. (Patient not taking: Reported on 08/20/2023)     Cholecalciferol (VITAMIN D PO) Take by mouth. (Patient not taking: Reported on 08/20/2023)     levothyroxine  (SYNTHROID ) 75 MCG tablet TAKE 1 TABLET BY MOUTH DAILY BEFORE BREAKFAST. 30 tablet 0   Multiple Vitamins-Minerals (ADULT GUMMY) CHEW Chew 1 Units by mouth daily.     Multiple Vitamins-Minerals (ZINC PO) Take by mouth.     pantoprazole  (PROTONIX ) 40 MG tablet Take 1 tablet (40 mg total) by mouth daily. 90 tablet 3   Current Facility-Administered Medications on File Prior to Visit  Medication Dose Route Frequency Provider Last Rate Last Admin   0.9 %  sodium chloride  infusion  500 mL Intravenous Once Nandigam, Kavitha V, MD       No Known Allergies  Family History  Problem Relation Age of Onset   Colon polyps Mother    Stomach cancer Maternal Grandmother    Colon cancer Neg Hx    Esophageal cancer Neg Hx    Liver disease Neg Hx     Objective:   Physical Exam There were no vitals taken for this visit.  Wt Readings from Last 3 Encounters:  08/20/23 164 lb (74.4 kg)  07/26/23 164 lb (74.4 kg)  06/22/23 164 lb (74.4 kg)   Constitutional: normal weight, in NAD Eyes:  EOMI, no exophthalmos ENT: no neck masses, no cervical lymphadenopathy Cardiovascular: RRR, No MRG Respiratory: CTA B Musculoskeletal: no deformities Skin:no rashes Neurological: no tremor with outstretched hands  Assessment:     1. Left thyroid  nodule  - 12/07/2012: thyroid  ultrasound with 3.1 x 2.5 x 2.1 cm in the left thyroid  lobe, without calcifications. There is no internal blood flow. She also had the left cervical lymph node measuring 9 x 5 x 7 mm (with fatty hilum).   - FNA (12/29/2012): Adequacy Reason Satisfactory For Evaluation. Diagnosis THYROID , FINE NEEDLE ASPIRATION, LEFT LOBE: FOLLICULAR LESION OF UNDETERMINED SIGNIFICANCE. SEE COMMENT. COMMENT: THE SPECIMEN IS HYPERCELLULAR AND  CONSISTS OF SMALL TO LARGE SIZED SHEETS OF FOLLICULAR EPITHELIAL CELLS WITH MILD CYTOLOGIC ATYPIA. THERE IS MINIMAL BACKGROUND COLLOID. BASED ON THESE FINDINGS, A FOLLICULAR LESION/NEOPLASM CAN NOT BE ENTIRELY RULED OUT.  Called pt about FLUS results >> ordered repeat Bx in 6 months  - repeat FNA (10/06/2013): FINDINGS CONSISTENT WITH NON-NEOPLASTIC GOITER.  - thyroid  U/S (11/27/2014): Right thyroid  lobe: 3.9 x 1.8 x 1.7 cm. Diffusely heterogeneous right thyroid  lobe. No discrete nodule. Left thyroid  lobe: 3.9 x 2.1 x 2.2 cm. Similar appearance of large solid and slightly hyperechoic mass occupying the majority of the mid and upper gland. Today the mass measures 3.3 x 2.3 x 2.1 cm compared to 3.1 x 2.5 x 2.1 cm previously. This  subtle difference is within measurement error and not felt to be clinically significant. Isthmus Thickness: 0.2 cm. Two small hypoechoic nodules are present within the isthmus measuring 5 and 6 mm respectively. These are not discretely measured on the prior study, however in retrospect theyn appear unchanged. Lymphadenopathy: No suspicious adenopathy.  IMPRESSION: No significant interval change in the dominant solid hyperechoic nodule occupying the majority of the left thyroid  gland.  - thyroid  U/S (04/06/2017): Parenchymal Echotexture: Moderately heterogenous Isthmus: 0.3 cm Right lobe: Ir 4.3 cm x 1.5 cm x 1.3 cm Left lobe: 5.0 cm x 2.4 cm x 2.4 cm ___________________________________________   Nodule # 1: Location: Isthmus; Mid Maximum size: 0.6 cm; Other 2 dimensions: 0.6 cm x 0.3 cm Composition: cannot determine (2) Echogenicity: hypoechoic (2) Nodule does not meet criteria for surveillance or biopsy _________________________________________________________   Nodule # 2: Location: Isthmus; Mid Maximum size: 0.6 cm; Other 2 dimensions: 0.3 cm x 0.5 cm Composition: cannot determine (2) Echogenicity: hypoechoic (2) Nodule does not meet criteria for  surveillance or biopsy _________________________________________________________   Nodule # 3: Location: Left; Mid Maximum size: 3.3 cm; Other 2 dimensions: 2.4 cm x 2.4 cm (Previous: 3.3 cm x 2.3 cm x 2.0 cm) Composition: solid/almost completely solid (2) Echogenicity: isoechoic (1) Nodule has been previously biopsied with benign result.  __________________________________________________   No adenopathy   IMPRESSION: Multinodular thyroid  again demonstrated. Left thyroid  nodule has been previously biopsied with benign result. No other thyroid  nodule meets criteria for biopsy or surveillance, as designated by the newly established ACR TI-RADS criteria.  - thyroid  U/S (02/03/2019): FINDINGS: Parenchymal Echotexture: Moderately heterogenous Isthmus: 0.3 cm thickness, stable Right lobe: 4.2 x 1.5 x 1 cm, previously 4.3 x 1.5 x 1.3 Left lobe: 5 x 2.5 x 2 cm, previously 5 x 2.4 x 2.4  __________________________________________________   0.5 cm hypoechoic nodule without calcifications, isthmus, previously 0.6; This nodule does NOT meet TI-RADS criteria for biopsy or dedicated follow-up.   0.7 cm hypoechoic inferior isthmic nodule without calcifications, previously 0.6; This nodule does NOT meet TI-RADS criteria for biopsy or dedicated follow-up.   3.3 x 2.4 x 2.2 cm mid left nodule, previously 3.3 x 2.4 x 2.4; this was previously biopsied   IMPRESSION: 1. Mild thyromegaly with stable nodules. None meets criteria for biopsy or dedicated imaging follow-up.  - thyroid  U/S (06/29/2023): Parenchymal Echotexture: Moderately heterogenous  Isthmus: 0.6 cm  Right lobe: 4.0 x 1.3 x 1.1 cm  Left lobe: 4.4 x 2.3 x 2.6 cm  _________________________________________________________   Estimated total number of nodules >/= 1 cm: 1  _________________________________________________________   Nodule # 1:  Prior biopsy: Yes  Location: Left; Mid  Maximum size: 3.2 cm; Other 2 dimensions: 2.2  x 2.2 cm, previously, 3.3 cm  Composition: solid/almost completely solid (2)  Echogenicity: isoechoic (1) Stable left mid thyroid  nodule measuring approximately 3.2 cm in maximum diameter. This nodule has now been stable for 10 years.  _________________________________________________________   No enlarged or abnormal appearing lymph nodes are identified.   IMPRESSION: Stable 3.2 cm left mid thyroid  nodule which has been stable for 10 years. This nodule has been previously sampled twice.  2. Hashimoto's hypothyroidism    Plan:     1. L Thyroid  nodule -She has a history of a large left thyroid  nodule, without neck compression symptoms.  This nodule was biopsied twice, with initial inconclusive results (FLUS), but then benign results.  She also had 2 small thyroid  nodules, subcentimeter, not worrisome.  The left  thyroid  nodule was stable over 10 years, as per the latest ultrasound results from 06/2023. -She has a history of dysphagia for which she had esophageal dilations in the past.  She sees gastroenterology. -At today's visit she has no neck compression symptoms or masses felt on palpation of her neck -Will continue to follow her clinically for this -I will see her back in 1 year  2. Hashimoto's hypothyroidism - latest thyroid  labs reviewed with pt. >> normal: Lab Results  Component Value Date   TSH 3.91 06/22/2023  - she continues on LT4 75 mcg daily, dose increased at last visit, after the above results returned.  She did not return for repeat labs afterwards. - pt feels good on this dose.  At last visit she had more fatigue, but this improved after increasing levothyroxine  dose. - we discussed about taking the thyroid  hormone every day, with water, >30 minutes before breakfast, separated by >4 hours from acid reflux medications, calcium, iron, multivitamins. Pt. is taking it correctly. - will check thyroid  tests today: TSH and fT4 - If labs are abnormal, she will need to return  for repeat TFTs in 1.5 months  Needs labs drawn while laying down - butterfly needle.  Needs refills.  Lela Fendt, MD PhD [facility]

## 2024-06-27 ENCOUNTER — Ambulatory Visit: Payer: Self-pay | Admitting: Internal Medicine

## 2024-06-27 LAB — T4, FREE: Free T4: 1.5 ng/dL (ref 0.8–1.8)

## 2024-06-27 LAB — TSH: TSH: 1.49 m[IU]/L (ref 0.40–4.50)

## 2024-06-27 MED ORDER — LEVOTHYROXINE SODIUM 75 MCG PO TABS: 75.0000 ug | ORAL_TABLET | Freq: Every day | ORAL | 3 refills | Status: AC

## 2024-06-27 NOTE — Addendum Note (Signed)
 Addended by: TRIXIE FILE on: 06/27/2024 08:36 AM   Modules accepted: Orders

## 2024-07-17 ENCOUNTER — Encounter: Payer: Self-pay | Admitting: Gastroenterology

## 2024-08-31 ENCOUNTER — Ambulatory Visit: Admitting: Family Medicine

## 2025-07-09 ENCOUNTER — Ambulatory Visit: Admitting: Internal Medicine
